# Patient Record
Sex: Female | Born: 2000 | Race: Black or African American | Hispanic: No | Marital: Single | Smoking: Never smoker
Health system: Southern US, Community
[De-identification: ages and names within clinical notes are randomized; demographics above are authoritative.]

## PROBLEM LIST (undated history)

## (undated) ENCOUNTER — Inpatient Hospital Stay (HOSPITAL_COMMUNITY): Payer: Self-pay

## (undated) DIAGNOSIS — E860 Dehydration: Secondary | ICD-10-CM

## (undated) DIAGNOSIS — F32A Depression, unspecified: Secondary | ICD-10-CM

## (undated) DIAGNOSIS — D649 Anemia, unspecified: Secondary | ICD-10-CM

## (undated) DIAGNOSIS — Z789 Other specified health status: Secondary | ICD-10-CM

## (undated) DIAGNOSIS — F329 Major depressive disorder, single episode, unspecified: Secondary | ICD-10-CM

## (undated) HISTORY — PX: NO PAST SURGERIES: SHX2092

## (undated) HISTORY — DX: Other specified health status: Z78.9

---
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
## 2018-01-09 ENCOUNTER — Other Ambulatory Visit: Payer: Medicaid Other

## 2018-01-09 ENCOUNTER — Ambulatory Visit (INDEPENDENT_AMBULATORY_CARE_PROVIDER_SITE_OTHER): Payer: Medicaid Other | Admitting: Women's Health

## 2018-01-09 ENCOUNTER — Encounter: Payer: Self-pay | Admitting: Women's Health

## 2018-01-09 ENCOUNTER — Other Ambulatory Visit: Payer: Self-pay

## 2018-01-09 VITALS — BP 121/65 | HR 90 | Wt 155.0 lb

## 2018-01-09 DIAGNOSIS — Z3402 Encounter for supervision of normal first pregnancy, second trimester: Secondary | ICD-10-CM

## 2018-01-09 DIAGNOSIS — Z1389 Encounter for screening for other disorder: Secondary | ICD-10-CM

## 2018-01-09 DIAGNOSIS — Z23 Encounter for immunization: Secondary | ICD-10-CM | POA: Diagnosis not present

## 2018-01-09 DIAGNOSIS — Z3A27 27 weeks gestation of pregnancy: Secondary | ICD-10-CM

## 2018-01-09 DIAGNOSIS — Z331 Pregnant state, incidental: Secondary | ICD-10-CM

## 2018-01-09 DIAGNOSIS — Z131 Encounter for screening for diabetes mellitus: Secondary | ICD-10-CM

## 2018-01-09 LAB — POCT URINALYSIS DIPSTICK OB
Glucose, UA: NEGATIVE
Ketones, UA: NEGATIVE
LEUKOCYTES UA: NEGATIVE
Nitrite, UA: NEGATIVE
POC,PROTEIN,UA: NEGATIVE
RBC UA: NEGATIVE

## 2018-01-09 NOTE — Patient Instructions (Signed)
Taylor Poole, I greatly value your feedback.  If you receive a survey following your visit with [place] today, we appreciate you taking the time to fill it out.  Thanks, Joellyn Haff, CNM, WHNP-BC   Call the office [phone]) or go to [facility] if:  You begin to have strong, frequent contractions  Your water breaks.  Sometimes it is a big gush of fluid, sometimes it is just a trickle that keeps getting your panties wet or running down your legs  You have vaginal bleeding.  It is normal to have a small amount of spotting if your cervix was checked.   You don't feel your baby moving like normal.  If you don't, get you something to eat and drink and lay down and focus on feeling your baby move.  You should feel at least 10 movements in 2 hours.  If you don't, you should call the office or go to [facility]    Tdap Vaccine  It is recommended that you get the Tdap vaccine during the third trimester of EACH pregnancy to help protect your baby from getting pertussis (whooping cough)  27-36 weeks is the BEST time to do this so that you can pass the protection on to your baby. During pregnancy is better than after pregnancy, but if you are unable to get it during pregnancy it will be offered at the hospital.   You can get this vaccine with [place], at the health department, your family doctor, or some local pharmacies  Everyone who will be around your baby should also be up-to-date on their vaccines before the baby comes. Adults (who are not pregnant) only need 1 dose of Tdap during adulthood.   Third Trimester of Pregnancy The third trimester is from week 29 through week 42, months 7 through 9. The third trimester is a time when the fetus is growing rapidly. At the end of the ninth month, the fetus is about 20 inches in length and weighs 6-10 pounds.  BODY CHANGES Your body goes through many changes during pregnancy. The changes vary from woman to woman.   Your weight will continue to  increase. You can expect to gain 25-35 pounds (11-16 kg) by the end of the pregnancy.  You may begin to get stretch marks on your hips, abdomen, and breasts.  You may urinate more often because the fetus is moving lower into your pelvis and pressing on your bladder.  You may develop or continue to have heartburn as a result of your pregnancy.  You may develop constipation because certain hormones are causing the muscles that push waste through your intestines to slow down.  You may develop hemorrhoids or swollen, bulging veins (varicose veins).  You may have pelvic pain because of the weight gain and pregnancy hormones relaxing your joints between the bones in your pelvis. Backaches may result from overexertion of the muscles supporting your posture.  You may have changes in your hair. These can include thickening of your hair, rapid growth, and changes in texture. Some women also have hair loss during or after pregnancy, or hair that feels dry or thin. Your hair will most likely return to normal after your baby is born.  Your breasts will continue to grow and be tender. A yellow discharge may leak from your breasts called colostrum.  Your belly button may stick out.  You may feel short of breath because of your expanding uterus.  You may notice the fetus "dropping," or moving lower in  your abdomen.  You may have a bloody mucus discharge. This usually occurs a few days to a week before labor begins.  Your cervix becomes thin and soft (effaced) near your due date. WHAT TO EXPECT AT YOUR PRENATAL EXAMS  You will have prenatal exams every 2 weeks until week 36. Then, you will have weekly prenatal exams. During a routine prenatal visit:  You will be weighed to make sure you and the fetus are growing normally.  Your blood pressure is taken.  Your abdomen will be measured to track your baby's growth.  The fetal heartbeat will be listened to.  Any test results from the previous visit  will be discussed.  You may have a cervical check near your due date to see if you have effaced. At around 36 weeks, your caregiver will check your cervix. At the same time, your caregiver will also perform a test on the secretions of the vaginal tissue. This test is to determine if a type of bacteria, Group B streptococcus, is present. Your caregiver will explain this further. Your caregiver may ask you:  What your birth plan is.  How you are feeling.  If you are feeling the baby move.  If you have had any abnormal symptoms, such as leaking fluid, bleeding, severe headaches, or abdominal cramping.  If you have any questions. Other tests or screenings that may be performed during your third trimester include:  Blood tests that check for low iron levels (anemia).  Fetal testing to check the health, activity level, and growth of the fetus. Testing is done if you have certain medical conditions or if there are problems during the pregnancy. FALSE LABOR You may feel small, irregular contractions that eventually go away. These are called Braxton Hicks contractions, or false labor. Contractions may last for hours, days, or even weeks before true labor sets in. If contractions come at regular intervals, intensify, or become painful, it is best to be seen by your caregiver.  SIGNS OF LABOR   Menstrual-like cramps.  Contractions that are 5 minutes apart or less.  Contractions that start on the top of the uterus and spread down to the lower abdomen and back.  A sense of increased pelvic pressure or back pain.  A watery or bloody mucus discharge that comes from the vagina. If you have any of these signs before the 37th week of pregnancy, call your caregiver right away. You need to go to the hospital to get checked immediately. HOME CARE INSTRUCTIONS   Avoid all smoking, herbs, alcohol, and unprescribed drugs. These chemicals affect the formation and growth of the baby.  Follow your  caregiver's instructions regarding medicine use. There are medicines that are either safe or unsafe to take during pregnancy.  Exercise only as directed by your caregiver. Experiencing uterine cramps is a good sign to stop exercising.  Continue to eat regular, healthy meals.  Wear a good support bra for breast tenderness.  Do not use hot tubs, steam rooms, or saunas.  Wear your seat belt at all times when driving.  Avoid raw meat, uncooked cheese, cat litter boxes, and soil used by cats. These carry germs that can cause birth defects in the baby.  Take your prenatal vitamins.  Try taking a stool softener (if your caregiver approves) if you develop constipation. Eat more high-fiber foods, such as fresh vegetables or fruit and whole grains. Drink plenty of fluids to keep your urine clear or pale yellow.  Take warm sitz baths to  soothe any pain or discomfort caused by hemorrhoids. Use hemorrhoid cream if your caregiver approves.  If you develop varicose veins, wear support hose. Elevate your feet for 15 minutes, 3-4 times a day. Limit salt in your diet.  Avoid heavy lifting, wear low heal shoes, and practice good posture.  Rest a lot with your legs elevated if you have leg cramps or low back pain.  Visit your dentist if you have not gone during your pregnancy. Use a soft toothbrush to brush your teeth and be gentle when you floss.  A sexual relationship may be continued unless your caregiver directs you otherwise.  Do not travel far distances unless it is absolutely necessary and only with the approval of your caregiver.  Take prenatal classes to understand, practice, and ask questions about the labor and delivery.  Make a trial run to the hospital.  Pack your hospital bag.  Prepare the baby's nursery.  Continue to go to all your prenatal visits as directed by your caregiver. SEEK MEDICAL CARE IF:  You are unsure if you are in labor or if your water has broken.  You have  dizziness.  You have mild pelvic cramps, pelvic pressure, or nagging pain in your abdominal area.  You have persistent nausea, vomiting, or diarrhea.  You have a bad smelling vaginal discharge.  You have pain with urination. SEEK IMMEDIATE MEDICAL CARE IF:   You have a fever.  You are leaking fluid from your vagina.  You have spotting or bleeding from your vagina.  You have severe abdominal cramping or pain.  You have rapid weight loss or gain.  You have shortness of breath with chest pain.  You notice sudden or extreme swelling of your face, hands, ankles, feet, or legs.  You have not felt your baby move in over an hour.  You have severe headaches that do not go away with medicine.  You have vision changes. Document Released: 02/19/2001 Document Revised: 03/02/2013 Document Reviewed: 04/28/2012 [facility] Patient Information 2015 [place], [place]. This information is not intended to replace advice given to you by your health care provider. Make sure you discuss any questions you have with your health care provider.

## 2018-01-09 NOTE — Progress Notes (Signed)
   LOW-RISK PREGNANCY VISIT Patient name: Taylor Poole MRN [id number]  Date of birth: Jul 01, 2000 Chief Complaint:   Routine Prenatal Visit (Pn2 today)  History of Present Illness:   Taylor Poole is a 17 y.o. G1P0 female at [redacted]w[redacted]d with an Estimated Date of Delivery: 04/07/18 being seen today for ongoing management of a low-risk pregnancy.  Today she reports no complaints. Contractions: Not present. Vag. Bleeding: None.  Movement: Present. denies leaking of fluid. Review of Systems:   Pertinent items are noted in HPI Denies abnormal vaginal discharge w/ itching/odor/irritation, headaches, visual changes, shortness of breath, chest pain, abdominal pain, severe nausea/vomiting, or problems with urination or bowel movements unless otherwise stated above. Pertinent History Reviewed:  Reviewed past medical,surgical, social, obstetrical and family history.  Reviewed problem list, medications and allergies. Physical Assessment:   Vitals:   01/09/18 1015  BP: 121/65  Pulse: 90  Weight: 155 lb (70.3 kg)  Body mass index is 28.12 kg/m.        Physical Examination:   General appearance: Well appearing, and in no distress  Mental status: Alert, oriented to person, place, and time  Skin: Warm & dry  Cardiovascular: Normal heart rate noted  Respiratory: Normal respiratory effort, no distress  Abdomen: Soft, gravid, nontender  Pelvic: Cervical exam deferred         Extremities: Edema: None  Fetal Status: Fetal Heart Rate (bpm): 148 Fundal Height: 26 cm Movement: Present    Results for orders placed or performed in visit on 01/09/18 (from the past 24 hour(s))  POC Urinalysis Dipstick OB   Collection Time: 01/09/18 10:16 AM  Result Value Ref Range   Color, UA     Clarity, UA     Glucose, UA Negative Negative   Bilirubin, UA     Ketones, UA neg    Spec Grav, UA     Blood, UA neg    pH, UA     POC Protein UA Negative Negative, Trace   Urobilinogen, UA     Nitrite, UA neg    Leukocytes,  UA Negative Negative   Appearance     Odor      Assessment & Plan:  1) Low-risk pregnancy G1P0 at [redacted]w[redacted]d with an Estimated Date of Delivery: 04/07/18    Meds: No orders of the defined types were placed in this encounter.  Labs/procedures today: pn2, flu & tdap  Plan:  Continue routine obstetrical care   Reviewed: Preterm labor symptoms and general obstetric precautions including but not limited to vaginal bleeding, contractions, leaking of fluid and fetal movement were reviewed in detail with the patient.  All questions were answered  Follow-up: Return in about 4 weeks (around 02/06/2018) for LROB.  Orders Placed This Encounter  Procedures  . Tdap vaccine greater than or equal to 19yo IM  . POC Urinalysis Dipstick OB   Cheral Marker CNM, [facility] 01/09/2018 10:27 AM

## 2018-01-10 LAB — CBC
HEMOGLOBIN: 11.4 g/dL (ref 11.1–15.9)
Hematocrit: 33.5 % — ABNORMAL LOW (ref 34.0–46.6)
MCH: 30 pg (ref 26.6–33.0)
MCHC: 34 g/dL (ref 31.5–35.7)
MCV: 88 fL (ref 79–97)
Platelets: 130 10*3/uL — ABNORMAL LOW (ref 150–450)
RBC: 3.8 x10E6/uL (ref 3.77–5.28)
RDW: 13.5 % (ref 12.3–15.4)
WBC: 11.2 10*3/uL — ABNORMAL HIGH (ref 3.4–10.8)

## 2018-01-10 LAB — ANTIBODY SCREEN: ANTIBODY SCREEN: NEGATIVE

## 2018-01-10 LAB — GLUCOSE TOLERANCE, 2 HOURS W/ 1HR
GLUCOSE, 2 HOUR: 97 mg/dL (ref 65–152)
GLUCOSE, FASTING: 77 mg/dL (ref 65–91)
Glucose, 1 hour: 92 mg/dL (ref 65–179)

## 2018-01-10 LAB — RPR: RPR Ser Ql: NONREACTIVE

## 2018-01-10 LAB — HIV ANTIBODY (ROUTINE TESTING W REFLEX): HIV SCREEN 4TH GENERATION: NONREACTIVE

## 2018-01-26 ENCOUNTER — Emergency Department (HOSPITAL_COMMUNITY)
Admission: EM | Admit: 2018-01-26 | Discharge: 2018-01-27 | Disposition: A | Discharge status: Home / Self Care | Payer: Medicaid Other | Attending: Emergency Medicine | Admitting: Emergency Medicine

## 2018-01-26 ENCOUNTER — Encounter (HOSPITAL_COMMUNITY): Payer: Self-pay | Admitting: Emergency Medicine

## 2018-01-26 ENCOUNTER — Other Ambulatory Visit: Payer: Self-pay

## 2018-01-26 DIAGNOSIS — B3731 Acute candidiasis of vulva and vagina: Secondary | ICD-10-CM

## 2018-01-26 DIAGNOSIS — B373 Candidiasis of vulva and vagina: Secondary | ICD-10-CM | POA: Diagnosis not present

## 2018-01-26 DIAGNOSIS — R300 Dysuria: Secondary | ICD-10-CM | POA: Insufficient documentation

## 2018-01-26 DIAGNOSIS — Z3A28 28 weeks gestation of pregnancy: Secondary | ICD-10-CM | POA: Diagnosis not present

## 2018-01-26 DIAGNOSIS — O2343 Unspecified infection of urinary tract in pregnancy, third trimester: Secondary | ICD-10-CM | POA: Diagnosis not present

## 2018-01-26 DIAGNOSIS — Z79899 Other long term (current) drug therapy: Secondary | ICD-10-CM | POA: Insufficient documentation

## 2018-01-26 DIAGNOSIS — O9989 Other specified diseases and conditions complicating pregnancy, childbirth and the puerperium: Secondary | ICD-10-CM | POA: Diagnosis present

## 2018-01-26 LAB — WET PREP, GENITAL
CLUE CELLS WET PREP: NONE SEEN
SPERM: NONE SEEN
Trich, Wet Prep: NONE SEEN

## 2018-01-26 LAB — URINALYSIS, ROUTINE W REFLEX MICROSCOPIC
Bilirubin Urine: NEGATIVE
Glucose, UA: 50 mg/dL — AB
Hgb urine dipstick: NEGATIVE
Ketones, ur: NEGATIVE mg/dL
Nitrite: NEGATIVE
PH: 6 (ref 5.0–8.0)
PROTEIN: NEGATIVE mg/dL
Specific Gravity, Urine: 1.029 (ref 1.005–1.030)

## 2018-01-26 NOTE — ED Triage Notes (Signed)
Pt states she had sex with her baby father last night and she noticed some burning with urination and vaginal pain today. Pt and pts father have recently been treated for STD. Pt denies fever.

## 2018-01-27 MED ORDER — CEPHALEXIN 500 MG PO CAPS: 500.0000 mg | ORAL_CAPSULE | Freq: Four times a day (QID) | ORAL | 0 refills | Status: DC

## 2018-01-27 MED ORDER — CLOTRIMAZOLE 1 % VA CREA: 1.0000 | TOPICAL_CREAM | Freq: Every day | VAGINAL | 0 refills | Status: DC

## 2018-01-27 NOTE — ED Provider Notes (Signed)
[facility] EMERGENCY DEPARTMENT Provider Note   CSN: [id number] Arrival date & time: 01/26/18  2143     History   Chief Complaint Chief Complaint  Patient presents with  . Dysuria    HPI Taylor Poole is a 17 y.o. female.  HPI   Taylor Poole is a 17 y.o. female [redacted] weeks pregnant, G1P0Ab0 who presents to the Emergency Department complaining of vaginal irritation and burning with urination upon waking.  She states that she had unprotected intercourse on the evening prior to onset of her symptoms and she is concerned that she may have a urinary tract infection or an STD.  She denies fever, chills, abdominal pain, vaginal bleeding or excessive vaginal discharge.  She receives routine prenatal care and denies any complications with pregnancy and endorses frequent fetal movement.  She also states that she and her partner were treated for chlamydia in August of this year.  Past Medical History:  Diagnosis Date  . Dehydration 10/09/2016  . Medical history non-contributory     Patient Active Problem List   Diagnosis Date Noted  . 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 GBS bacteriuria 09/26/2017  . Supervision of normal first pregnancy 09/24/2017  . Intractable vomiting with nausea 07/03/2017  . Cannabinoid hyperemesis syndrome (HCC) 07/03/2017  . Hypokalemia 07/03/2017  . Gastroenteritis, acute 10/05/2016    Past Surgical History:  Procedure Laterality Date  . NO PAST SURGERIES       OB History    Gravida  1   Para      Term      Preterm      AB      Living        SAB      TAB      Ectopic      Multiple      Live Births               Home Medications    Prior to Admission medications   Medication Sig Start Date End Date Taking? Authorizing Provider  cephALEXin (KEFLEX) 500 MG capsule Take 1 capsule (500 mg total) by mouth 4 (four) times daily. 01/27/18   Wannetta Langland, PA-C  clotrimazole (GYNE-LOTRIMIN) 1 % vaginal cream Place 1 Applicatorful  vaginally at bedtime. For 7 days 01/27/18   Lucile Hillmann, PA-C  ondansetron (ZOFRAN ODT) 4 MG disintegrating tablet Take 1 tablet (4 mg total) by mouth every 6 (six) hours as needed for nausea. Patient not taking: Reported on 10/30/2017 09/24/17   Jacklyn Shell, CNM  Prenatal Vit-Fe Fumarate-FA (PRENATAL COMPLETE) 14-0.4 MG TABS Take 1 tablet by mouth daily. 07/28/17   Rancour, Jeannett Senior, MD  promethazine (PHENERGAN) 25 MG tablet Take 1 tablet (25 mg total) by mouth every 6 (six) hours as needed. Patient not taking: Reported on 09/24/2017 07/04/17   Donnetta Hutching, MD  terconazole (TERAZOL 7) 0.4 % vaginal cream Place 1 applicator vaginally at bedtime. Patient not taking: Reported on 01/09/2018 12/23/17   Tilda Burrow, MD    Family History No family history on file.  Social History Social History   Tobacco Use  . Smoking status: Never Smoker  . Smokeless tobacco: Never Used  Substance Use Topics  . Alcohol use: No  . Drug use: Not Currently    Types: Marijuana     Allergies   Patient has no known allergies.   Review of Systems Review of Systems  Constitutional: Negative for activity change, appetite change, chills and fever.  Respiratory: Negative for chest  tightness and shortness of breath.   Gastrointestinal: Negative for abdominal pain, diarrhea, nausea and vomiting.  Genitourinary: Positive for dysuria and vaginal pain (Vaginal irritation). Negative for decreased urine volume, difficulty urinating, flank pain, frequency, hematuria, pelvic pain, urgency, vaginal bleeding and vaginal discharge.  Musculoskeletal: Negative for back pain.  Skin: Negative for rash.  Neurological: Negative for dizziness, weakness and numbness.  Hematological: Negative for adenopathy.  Psychiatric/Behavioral: Negative for confusion.     Physical Exam Updated Vital Signs BP (!) 103/53 (BP Location: Right Arm)   Pulse 76   Temp 98.1 F (36.7 C) (Oral)   Resp 17   LMP 07/01/2017  (Exact Date)   SpO2 100%   Physical Exam  Constitutional: She appears well-developed and well-nourished. No distress.  HENT:  Head: Normocephalic and atraumatic.  Cardiovascular: Normal rate, regular rhythm and intact distal pulses.  No murmur heard. Pulmonary/Chest: Effort normal and breath sounds normal. No respiratory distress. She has no wheezes. She has no rales.  Abdominal: Normal appearance. There is no hepatosplenomegaly. There is no tenderness. There is no rigidity, no guarding, no CVA tenderness and no tenderness at McBurney's point.  Abdomen is soft, gravid.  No CVA tenderness  Genitourinary:  Genitourinary Comments: Pelvic exam assisted by nursing staff.  Cervical loss is closed.  Small amount of milky white vaginal discharge present.  No vaginal bleeding.  No cervical motion tenderness.  No adnexal masses or tenderness palpated  Musculoskeletal: Normal range of motion. She exhibits no edema.  Neurological: She is alert. No sensory deficit.  Skin: Skin is warm and dry. No rash noted.  Psychiatric: She has a normal mood and affect.  Nursing note and vitals reviewed.    ED Treatments / Results  Labs (all labs ordered are listed, but only abnormal results are displayed) Labs Reviewed  WET PREP, GENITAL - Abnormal; Notable for the following components:      Result Value   Yeast Wet Prep HPF POC PRESENT (*)    WBC, Wet Prep HPF POC MODERATE (*)    All other components within normal limits  URINALYSIS, ROUTINE W REFLEX MICROSCOPIC - Abnormal; Notable for the following components:   APPearance HAZY (*)    Glucose, UA 50 (*)    Leukocytes, UA MODERATE (*)    Bacteria, UA RARE (*)    All other components within normal limits  GC/CHLAMYDIA PROBE AMP ([place]) NOT AT [facility]    EKG None  Radiology No results found.  Procedures Procedures (including critical care time)  Medications Ordered in ED Medications - No data to display   Initial Impression / Assessment  and Plan / ED Course  I have reviewed the triage vital signs and the nursing notes.  Pertinent labs & imaging results that were available during my care of the patient were reviewed by me and considered in my medical decision making (see chart for details).     FHT's 160  Patient well-appearing with dysuria during third trimester of first pregnancy.  No abdominal tenderness or vaginal bleeding.  She has upcoming appointment with her OB.  Wet prep shows presence of Candida and likely UTI.  Patient agrees to treatment plan with clotrimazole and Keflex.  She is appropriate for discharge home and agrees to close OB follow-up.  Return precautions discussed.  Final Clinical Impressions(s) / ED Diagnoses   Final diagnoses:  UTI (urinary tract infection) in pregnancy in third trimester  Vulvovaginal candidiasis    ED Discharge Orders  Ordered    clotrimazole (GYNE-LOTRIMIN) 1 % vaginal cream  Daily at bedtime     01/27/18 0024    cephALEXin (KEFLEX) 500 MG capsule  4 times daily     01/27/18 0024           Pauline Ausriplett, Quentez Lober, PA-C 01/28/18 1443    Long, Arlyss RepressJoshua G, MD 01/28/18 1524

## 2018-01-27 NOTE — Discharge Instructions (Addendum)
Drink plenty of water.  Take the medication as directed until its finished.  Follow-up next week with your OB provider.

## 2018-01-28 ENCOUNTER — Encounter: Payer: Self-pay | Admitting: *Deleted

## 2018-01-28 LAB — GC/CHLAMYDIA PROBE AMP (~~LOC~~) NOT AT ARMC
CHLAMYDIA, DNA PROBE: NEGATIVE
NEISSERIA GONORRHEA: NEGATIVE

## 2018-02-02 ENCOUNTER — Encounter (HOSPITAL_COMMUNITY): Payer: Self-pay | Admitting: Emergency Medicine

## 2018-02-02 ENCOUNTER — Other Ambulatory Visit: Payer: Self-pay | Admitting: Obstetrics and Gynecology

## 2018-02-02 ENCOUNTER — Other Ambulatory Visit: Payer: Self-pay

## 2018-02-02 ENCOUNTER — Inpatient Hospital Stay (HOSPITAL_COMMUNITY)
Admission: EM | Admit: 2018-02-02 | Discharge: 2018-02-03 | DRG: 833 | Disposition: A | Discharge status: Home / Self Care | Payer: Medicaid Other | Attending: Obstetrics and Gynecology | Admitting: Obstetrics and Gynecology

## 2018-02-02 DIAGNOSIS — Z3A30 30 weeks gestation of pregnancy: Secondary | ICD-10-CM

## 2018-02-02 DIAGNOSIS — S3991XA Unspecified injury of abdomen, initial encounter: Secondary | ICD-10-CM | POA: Diagnosis present

## 2018-02-02 DIAGNOSIS — R8271 Bacteriuria: Secondary | ICD-10-CM

## 2018-02-02 DIAGNOSIS — R109 Unspecified abdominal pain: Secondary | ICD-10-CM | POA: Diagnosis present

## 2018-02-02 DIAGNOSIS — O26893 Other specified pregnancy related conditions, third trimester: Secondary | ICD-10-CM | POA: Diagnosis present

## 2018-02-02 DIAGNOSIS — Z3400 Encounter for supervision of normal first pregnancy, unspecified trimester: Secondary | ICD-10-CM

## 2018-02-02 DIAGNOSIS — O99119 Other diseases of the blood and blood-forming organs and certain disorders involving the immune mechanism complicating pregnancy, unspecified trimester: Secondary | ICD-10-CM

## 2018-02-02 DIAGNOSIS — O368130 Decreased fetal movements, third trimester, not applicable or unspecified: Secondary | ICD-10-CM | POA: Diagnosis present

## 2018-02-02 DIAGNOSIS — D696 Thrombocytopenia, unspecified: Secondary | ICD-10-CM | POA: Diagnosis present

## 2018-02-02 DIAGNOSIS — S3991XS Unspecified injury of abdomen, sequela: Secondary | ICD-10-CM | POA: Diagnosis not present

## 2018-02-02 DIAGNOSIS — Z9189 Other specified personal risk factors, not elsewhere classified: Secondary | ICD-10-CM

## 2018-02-02 LAB — CBC
HCT: 34.6 % — ABNORMAL LOW (ref 36.0–46.0)
Hemoglobin: 11.5 g/dL — ABNORMAL LOW (ref 12.0–15.0)
MCH: 29.7 pg (ref 26.0–34.0)
MCHC: 33.2 g/dL (ref 30.0–36.0)
MCV: 89.4 fL (ref 80.0–100.0)
NRBC: 0 % (ref 0.0–0.2)
PLATELETS: 140 10*3/uL — AB (ref 150–400)
RBC: 3.87 MIL/uL (ref 3.87–5.11)
RDW: 13.8 % (ref 11.5–15.5)
WBC: 9.4 10*3/uL (ref 4.0–10.5)

## 2018-02-02 LAB — ABO/RH: ABO/RH(D): A POS

## 2018-02-02 LAB — TYPE AND SCREEN
ABO/RH(D): A POS
ANTIBODY SCREEN: NEGATIVE

## 2018-02-02 LAB — KLEIHAUER-BETKE STAIN
# Vials RhIg: 1
Fetal Cells %: 0 %
Quantitation Fetal Hemoglobin: 0 mL

## 2018-02-02 MED ORDER — LACTATED RINGERS IV SOLN
INTRAVENOUS | Status: DC
  Administered 2018-02-02: 19:00:00 via INTRAVENOUS

## 2018-02-02 MED ORDER — PRENATAL MULTIVITAMIN CH: 1.0000 | ORAL_TABLET | Freq: Every day | ORAL | Status: DC

## 2018-02-02 MED ORDER — ACETAMINOPHEN 325 MG PO TABS: 650.0000 mg | ORAL_TABLET | ORAL | Status: DC | PRN

## 2018-02-02 MED ORDER — ZOLPIDEM TARTRATE 5 MG PO TABS: 5.0000 mg | ORAL_TABLET | Freq: Every evening | ORAL | Status: DC | PRN
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
## 2018-02-02 NOTE — ED Notes (Signed)
Pt offered to use bathroom, states she does not have to at this time.
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
## 2018-02-03 DIAGNOSIS — O26893 Other specified pregnancy related conditions, third trimester: Secondary | ICD-10-CM

## 2018-02-03 DIAGNOSIS — O99119 Other diseases of the blood and blood-forming organs and certain disorders involving the immune mechanism complicating pregnancy, unspecified trimester: Secondary | ICD-10-CM

## 2018-02-03 DIAGNOSIS — Z9189 Other specified personal risk factors, not elsewhere classified: Secondary | ICD-10-CM

## 2018-02-03 DIAGNOSIS — S3991XS Unspecified injury of abdomen, sequela: Secondary | ICD-10-CM

## 2018-02-03 DIAGNOSIS — D696 Thrombocytopenia, unspecified: Secondary | ICD-10-CM | POA: Diagnosis present

## 2018-02-03 DIAGNOSIS — R109 Unspecified abdominal pain: Secondary | ICD-10-CM

## 2018-02-03 NOTE — Discharge Instructions (Signed)
° °  Follow these instructions at home:  Take over-the-counter and prescription medicines only as told by your health care provider. Do not take any medicines that your health care provider has not approved.  Arrange for help at home before and after you deliver your baby, especially if you had a cesarean delivery or if you lost a lot of blood.  Get plenty of rest and sleep.  Do not use illegal drugs.  Do not drink alcohol.  Do not have sexual intercourse until your health care provider says it is okay.  Do not use tampons or douche unless your health care provider says it is okay.  Do not use any products that contain nicotine or tobacco, such as cigarettes and e-cigarettes. If you need help quitting, ask your health care provider. Get help right away if:  You have vaginal bleeding or spotting.  You have any type of trauma, such as a fall, abdominal trauma, or a car accident.  You have abdominal pain.  You have continuous uterine contractions.  You have a hard, tender uterus.  You do not feel the baby move, or the baby moves very little. This information is not intended to replace advice given to you by your health care provider. Make sure you discuss any questions you have with your health care provider. Document Released: 02/25/2005 Document Revised: 10/26/2015 Document Reviewed: 09/17/2015 Elsevier Interactive Patient Education  [employer]2018 Elsevier Inc.

## 2018-02-03 NOTE — Progress Notes (Signed)
Pt discharged with printed instructions. Pt verbalized an understanding. No concerns noted. Somaya Grassi L Cynithia Hakimi, RN 
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
## 2018-02-04 ENCOUNTER — Encounter: Payer: Medicaid Other | Admitting: Advanced Practice Midwife

## 2018-02-12 ENCOUNTER — Ambulatory Visit (INDEPENDENT_AMBULATORY_CARE_PROVIDER_SITE_OTHER): Payer: Medicaid Other | Admitting: Obstetrics and Gynecology

## 2018-02-12 ENCOUNTER — Encounter: Payer: Self-pay | Admitting: Obstetrics and Gynecology

## 2018-02-12 VITALS — BP 112/70 | HR 78 | Wt 160.0 lb

## 2018-02-12 DIAGNOSIS — O99113 Other diseases of the blood and blood-forming organs and certain disorders involving the immune mechanism complicating pregnancy, third trimester: Secondary | ICD-10-CM | POA: Diagnosis not present

## 2018-02-12 DIAGNOSIS — R300 Dysuria: Secondary | ICD-10-CM

## 2018-02-12 DIAGNOSIS — Z658 Other specified problems related to psychosocial circumstances: Secondary | ICD-10-CM | POA: Insufficient documentation

## 2018-02-12 DIAGNOSIS — Z331 Pregnant state, incidental: Secondary | ICD-10-CM

## 2018-02-12 DIAGNOSIS — D696 Thrombocytopenia, unspecified: Secondary | ICD-10-CM | POA: Insufficient documentation

## 2018-02-12 DIAGNOSIS — O26893 Other specified pregnancy related conditions, third trimester: Secondary | ICD-10-CM

## 2018-02-12 DIAGNOSIS — Z1389 Encounter for screening for other disorder: Secondary | ICD-10-CM

## 2018-02-12 DIAGNOSIS — Z3403 Encounter for supervision of normal first pregnancy, third trimester: Secondary | ICD-10-CM

## 2018-02-12 DIAGNOSIS — Z3A32 32 weeks gestation of pregnancy: Secondary | ICD-10-CM

## 2018-02-12 LAB — POCT URINALYSIS DIPSTICK OB
Glucose, UA: NEGATIVE
Ketones, UA: NEGATIVE
NITRITE UA: NEGATIVE
POC,PROTEIN,UA: NEGATIVE
RBC UA: NEGATIVE

## 2018-02-12 NOTE — Patient Instructions (Signed)
Childbirth Classes  [facility]  Call to Register: [phone] or [phone]    or    Register Online: [email or web address]  THESE CLASSES FILL UP VERY QUICKLY, SO SIGN UP AS SOON AS YOU CAN!!! Please visit [facility] pregnancy website at [email or web address]   Option 1: Birth & Baby Series ? This series of 3 weekly classes helps you and your labor partner prepare [facility] at [facility]. ? Reviews newborn care, labor & birth, cesarean birth, pain management, and comfort techniques ? Maternity Care Center Tour of [facility] is included.  ? Cost: $60 per couple for insured or self-pay, $30 per couple for Medicaid  Option 2: Weekend Birth & Baby ? This class is a weekend version of our Birth & Baby series.  It is designed for parents who have a difficult time fitting several weeks of classes into their schedule.   ? Maternity Care Center Tour of [facility] is included ? Friday 6:30pm-8:30pm  Saturday 9am-4pm  ? Cost: $75 per couple for insured or self-pay, $30 per couple for Medicaid  Option 3: Natural Childbirth ? This series of 5 weekly classes is for expectant parents who want to learn and practice natural methods of coping with the process of labor and childbirth.     ? Maternity Care Center Tour of [facility] is included ? Cost: $75 per couple for insured or self-pay, $30 per couple for Medicaid  Option 4: Online Birth & Baby ? This online class offers you the freedom to complete a Birth & Baby series in the comfort of your own home.  The flexibility of this option allows you to review sections at your own pace, at times convenient to you and your support people.    ? Cost: $60 for 60 days of online access       Other Available Classes  Baby & Me Enjoy this time to discuss newborn & infant parenting topics and family adjustment issues with other new mothers in a relaxed environment. Each week brings a new speaker or  baby-centered activity. We encourage mothers and their babies (birth to crawling) to join [place] every Thursday in the [facility] at 11:00am. You are welcome to visit this group even if you haven't delivered yet! It's wonderful to make new friends early and watch other moms interact with their babies. No registration or fee.  Big Brother/Big Sister Let your children share in the joy of a new brother or sister in this special class designed just for them. This class is designed for children ages 2 to 256, but any age is welcome. Please register each child individually.  Breastfeeding Support Group This group is a mother-to-mother support circle where moms have the opportunity to share their breastfeeding experiences. A Breastfeeding Support nurse is present for questions and concerns. Meets each Tuesday at 11:00am. No fee or registration.  Breastfeeding Your Baby Breastfeeding is a special time for mother and child. This class will help you feel ready to begin this important relationship. Your partner is encouraged to attend with you.  Caring For Baby This class is for expectant and adoptive parents who want to learn and practice the most up-to-date newborn care for their babies. Register only the mom-to-be and your partner can plan to come with you. (*Note: This class is included in the Birth & Baby series and the Weekend Birth & Baby classes.)  Comfort Techniques & Tour This 2-hour interactive class will provide you the opportunity to learn &  practice hands-on techniques with your partner that can help relieve some of the discomfort of labor and encourage your baby to rotate toward the best position for birth. A tour of the [facility]aternity Care Center is included.  °Daddy Boot Camp °This course offers Dads-to-be the tools and knowledge needed to feel confident on their journey to becoming new fathers.  °Grandparent Love °Expecting a grandbaby? Learn about the latest infant care  and safety recommendations and ways to support your own child as he or she transitions into the parenting role.  °Infant and Child CPR °Parents, grandparents, babysitters, and friends learn Cardio-Pulmonary Resuscitation skills for infants and children. Register each participant individually. (Note: This Family & Friends program does not offer certification.)  °Marvelous Multiples °Expecting twins, triplets, or more? This class covers the differences in labor, birth, parenting, and breastfeeding issues that face multiples’ parents. NICU tour is included.  °Mom Talk °This mom-led group offers support and connection to mothers as they journey through the adjustments and struggles of that sometimes overwhelming first year after the birth of a child. A member of our [facility] staff will be present to share resources and additional support if needed, as you care for yourself and baby. You are welcome to visit this group before you deliver! It's wonderful to meet new friends early and watch other moms interact with their babies. It’s held at [facility] at 10:00am each Tuesday morning and 6:00pm each Thursday evening. Babies (birth to crawling) welcome. No registration or fee.  °Waterbirth Class °Interested in a waterbirth? This informational class will help you discover whether waterbirth is the right fit for you.  °[facility] Virtual Maternity Tour °View a virtual tour of [facility]. In-person tours are available for participants of childbirth education classes.  °
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
## 2018-02-13 ENCOUNTER — Encounter: Payer: Medicaid Other | Admitting: Women's Health

## 2018-02-14 LAB — URINE CULTURE: ORGANISM ID, BACTERIA: NO GROWTH

## 2018-02-26 ENCOUNTER — Encounter: Payer: Medicaid Other | Admitting: Obstetrics and Gynecology

## 2018-03-11 NOTE — L&D Delivery Note (Addendum)
Delivery Note At 2:58 PM a viable female was delivered via Vaginal, Spontaneous (Presentation: vertex; OA).  APGAR: 8, 9; weight: pending. Placenta status: delivered spontaneously intact.  Cord: 3-vessel cord with the following complications: none.  Cord pH: N/A  Anesthesia: Epidural Episiotomy: None Lacerations: None Suture Repair: None Est. Blood Loss (mL): 20  The patient was noted to be complete and pushing. Patient noted to have epidural anesthesia.  The patient was asked to push and the head delivered spontaneously in the oa position, over an intact perineum. A nuchal cord was checked and none was noted. Compound left hand was present.    The anterior shoulder delivered easily and the posterior shoulder followed. The remainder of the infant was easily delivered and placed on mom's chest skin-to-skin where nursing personnel were in attendance. The infant was noted to have spontaneous cry and movement of all 4 extremities. The oropharynx and nasopharynx were bulb suctioned. After a 1-minute delay he cord was clamped x 2 and cut by the father of the baby.   The placenta delivered intact spontaneously. Placenta sent to pathology due to presumed Triple I with maternal fevers. Pitocin was started IV to firm the uterus.   Examination of the cervix and vaginal vault did not reveal any lacerations. A vaginal pack was then placed. Examination of the perineum showed no lacerations.   All sponge and needle counts were correct. Dr. Elita Quick was present for the entire procedure.     Mom to postpartum.  Baby to Couplet care / Skin to Skin.  Dollene Cleveland 04/03/2018, 3:22 PM  OB FELLOW DELIVERY ATTESTATION  I was gloved and present for the delivery in its entirety, and I agree with the above resident's note.    Marcy Siren, D.O. OB Fellow  04/03/2018, 5:24 PM

## 2018-03-16 ENCOUNTER — Encounter: Payer: Medicaid Other | Admitting: Obstetrics and Gynecology

## 2018-03-17 ENCOUNTER — Encounter: Payer: Medicaid Other | Admitting: Obstetrics and Gynecology

## 2018-03-26 ENCOUNTER — Inpatient Hospital Stay (HOSPITAL_COMMUNITY)
Admission: AD | Admit: 2018-03-26 | Discharge: 2018-03-26 | Disposition: A | Discharge status: Home / Self Care | Payer: Medicaid Other | Attending: Obstetrics and Gynecology | Admitting: Obstetrics and Gynecology

## 2018-03-26 ENCOUNTER — Encounter (HOSPITAL_COMMUNITY): Payer: Self-pay

## 2018-03-26 DIAGNOSIS — O163 Unspecified maternal hypertension, third trimester: Secondary | ICD-10-CM | POA: Insufficient documentation

## 2018-03-26 DIAGNOSIS — N949 Unspecified condition associated with female genital organs and menstrual cycle: Secondary | ICD-10-CM

## 2018-03-26 DIAGNOSIS — O471 False labor at or after 37 completed weeks of gestation: Secondary | ICD-10-CM | POA: Diagnosis not present

## 2018-03-26 DIAGNOSIS — Z3A38 38 weeks gestation of pregnancy: Secondary | ICD-10-CM | POA: Insufficient documentation

## 2018-03-26 DIAGNOSIS — R102 Pelvic and perineal pain: Secondary | ICD-10-CM | POA: Insufficient documentation

## 2018-03-26 DIAGNOSIS — R0989 Other specified symptoms and signs involving the circulatory and respiratory systems: Secondary | ICD-10-CM

## 2018-03-26 DIAGNOSIS — R1031 Right lower quadrant pain: Secondary | ICD-10-CM | POA: Diagnosis present

## 2018-03-26 DIAGNOSIS — O26893 Other specified pregnancy related conditions, third trimester: Secondary | ICD-10-CM | POA: Diagnosis not present

## 2018-03-26 DIAGNOSIS — R8271 Bacteriuria: Secondary | ICD-10-CM

## 2018-03-26 LAB — URINALYSIS, ROUTINE W REFLEX MICROSCOPIC
BACTERIA UA: NONE SEEN
BILIRUBIN URINE: NEGATIVE
GLUCOSE, UA: NEGATIVE mg/dL
HGB URINE DIPSTICK: NEGATIVE
Ketones, ur: NEGATIVE mg/dL
NITRITE: NEGATIVE
Protein, ur: NEGATIVE mg/dL
SPECIFIC GRAVITY, URINE: 1.012 (ref 1.005–1.030)
pH: 6 (ref 5.0–8.0)

## 2018-03-26 LAB — COMPREHENSIVE METABOLIC PANEL
ALBUMIN: 3.1 g/dL — AB (ref 3.5–5.0)
ALK PHOS: 97 U/L (ref 38–126)
ALT: 13 U/L (ref 0–44)
ANION GAP: 7 (ref 5–15)
AST: 16 U/L (ref 15–41)
BILIRUBIN TOTAL: 0.5 mg/dL (ref 0.3–1.2)
BUN: 8 mg/dL (ref 6–20)
CALCIUM: 8.6 mg/dL — AB (ref 8.9–10.3)
CO2: 20 mmol/L — ABNORMAL LOW (ref 22–32)
Chloride: 105 mmol/L (ref 98–111)
Creatinine, Ser: 0.7 mg/dL (ref 0.44–1.00)
Glucose, Bld: 85 mg/dL (ref 70–99)
POTASSIUM: 3.9 mmol/L (ref 3.5–5.1)
Sodium: 132 mmol/L — ABNORMAL LOW (ref 135–145)
TOTAL PROTEIN: 6.5 g/dL (ref 6.5–8.1)

## 2018-03-26 LAB — CBC
HEMATOCRIT: 32.2 % — AB (ref 36.0–46.0)
HEMOGLOBIN: 10.6 g/dL — AB (ref 12.0–15.0)
MCH: 28 pg (ref 26.0–34.0)
MCHC: 32.9 g/dL (ref 30.0–36.0)
MCV: 85.2 fL (ref 80.0–100.0)
Platelets: 130 10*3/uL — ABNORMAL LOW (ref 150–400)
RBC: 3.78 MIL/uL — ABNORMAL LOW (ref 3.87–5.11)
RDW: 13.7 % (ref 11.5–15.5)
WBC: 6.9 10*3/uL (ref 4.0–10.5)
nRBC: 0 % (ref 0.0–0.2)

## 2018-03-26 LAB — PROTEIN / CREATININE RATIO, URINE
CREATININE, URINE: 96 mg/dL
PROTEIN CREATININE RATIO: 0.16 mg/mg{creat} — AB (ref 0.00–0.15)
TOTAL PROTEIN, URINE: 15 mg/dL

## 2018-03-26 NOTE — Discharge Instructions (Signed)
Round Ligament Pain  The round ligament is a cord of muscle and tissue that helps support the uterus. It can become a source of pain during pregnancy if it becomes stretched or twisted as the baby grows. The pain usually begins in the second trimester (13-28 weeks) of pregnancy, and it can come and go until the baby is delivered. It is not a serious problem, and it does not cause harm to the baby. Round ligament pain is usually a short, sharp, and pinching pain, but it can also be a dull, lingering, and aching pain. The pain is felt in the lower side of the abdomen or in the groin. It usually starts deep in the groin and moves up to the outside of the hip area. The pain may occur when you:  Suddenly change position, such as quickly going from a sitting to standing position.  Roll over in bed.  Cough or sneeze.  Do physical activity. Follow these instructions at home:   Watch your condition for any changes.  When the pain starts, relax. Then try any of these methods to help with the pain: ? Sitting down. ? Flexing your knees up to your abdomen. ? Lying on your side with one pillow under your abdomen and another pillow between your legs. ? Sitting in a warm bath for 15-20 minutes or until the pain goes away.  Take over-the-counter and prescription medicines only as told by your health care provider.  Move slowly when you sit down or stand up.  Avoid long walks if they cause pain.  Stop or reduce your physical activities if they cause pain.  Keep all follow-up visits as told by your health care provider. This is important. Contact a health care provider if:  Your pain does not go away with treatment.  You feel pain in your back that you did not have before.  Your medicine is not helping. Get help right away if:  You have a fever or chills.  You develop uterine contractions.  You have vaginal bleeding.  You have nausea or vomiting.  You have diarrhea.  You have pain  when you urinate. Summary  Round ligament pain is felt in the lower abdomen or groin. It is usually a short, sharp, and pinching pain. It can also be a dull, lingering, and aching pain.  This pain usually begins in the second trimester (13-28 weeks). It occurs because the uterus is stretching with the growing baby, and it is not harmful to the baby.  You may notice the pain when you suddenly change position, when you cough or sneeze, or during physical activity.  Relaxing, flexing your knees to your abdomen, lying on one side, or taking a warm bath may help to get rid of the pain.  Get help from your health care provider if the pain does not go away or if you have vaginal bleeding, nausea, vomiting, diarrhea, or painful urination. This information is not intended to replace advice given to you by your health care provider. Make sure you discuss any questions you have with your health care provider. Document Released: 12/05/2007 Document Revised: 08/13/2017 Document Reviewed: 08/13/2017 Elsevier Interactive Patient Education  2019 [employer] Inc[employer] Vaginal Delivery  Vaginal delivery means that you give birth by pushing your baby out of your birth canal (vagina). A team of health care providers will help you before, during, and after vaginal delivery. Birth experiences are unique for every woman and every pregnancy, and birth experiences vary depending on where  you choose to give birth. What happens when I arrive at the birth center or hospital? Once you are in labor and have been admitted into the hospital or birth center, your health care provider may:  Review your pregnancy history and any concerns that you have.  Insert an IV into one of your veins. This may be used to give you fluids and medicines.  Check your blood pressure, pulse, temperature, and heart rate (vital signs).  Check whether your bag of water (amniotic sac) has broken (ruptured).  Talk with you about your birth plan and  discuss pain control options. Monitoring Your health care provider may monitor your contractions (uterine monitoring) and your baby's heart rate (fetal monitoring). You may need to be monitored:  Often, but not continuously (intermittently).  All the time or for long periods at a time (continuously). Continuous monitoring may be needed if: ? You are taking certain medicines, such as medicine to relieve pain or make your contractions stronger. ? You have pregnancy or labor complications. Monitoring may be done by:  Placing a special stethoscope or a handheld monitoring device on your abdomen to check your baby's heartbeat and to check for contractions.  Placing monitors on your abdomen (external monitors) to record your baby's heartbeat and the frequency and length of contractions.  Placing monitors inside your uterus through your vagina (internal monitors) to record your baby's heartbeat and the frequency, length, and strength of your contractions. Depending on the type of monitor, it may remain in your uterus or on your baby's head until birth.  Telemetry. This is a type of continuous monitoring that can be done with external or internal monitors. Instead of having to stay in bed, you are able to move around during telemetry. Physical exam Your health care provider may perform frequent physical exams. This may include:  Checking how and where your baby is positioned in your uterus.  Checking your cervix to determine: ? Whether it is thinning out (effacing). ? Whether it is opening up (dilating). What happens during labor and delivery?  Normal labor and delivery is divided into the following three stages: Stage 1  This is the longest stage of labor.  This stage can last for hours or days.  Throughout this stage, you will feel contractions. Contractions generally feel mild, infrequent, and irregular at first. They get stronger, more frequent (about every 2-3 minutes), and more  regular as you move through this stage.  This stage ends when your cervix is completely dilated to 4 inches (10 cm) and completely effaced. Stage 2  This stage starts once your cervix is completely effaced and dilated and lasts until the delivery of your baby.  This stage may last from 20 minutes to 2 hours.  This is the stage where you will feel an urge to push your baby out of your vagina.  You may feel stretching and burning pain, especially when the widest part of your baby's head passes through the vaginal opening (crowning).  Once your baby is delivered, the umbilical cord will be clamped and cut. This usually occurs after waiting a period of 1-2 minutes after delivery.  Your baby will be placed on your bare chest (skin-to-skin contact) in an upright position and covered with a warm blanket. Watch your baby for feeding cues, like rooting or sucking, and help the baby to your breast for his or her first feeding. Stage 3  This stage starts immediately after the birth of your baby and ends after  you deliver the placenta.  This stage may take anywhere from 5 to 30 minutes.  After your baby has been delivered, you will feel contractions as your body expels the placenta and your uterus contracts to control bleeding. What can I expect after labor and delivery?  After labor is over, you and your baby will be monitored closely until you are ready to go home to ensure that you are both healthy. Your health care team will teach you how to care for yourself and your baby.  You and your baby will stay in the same room (rooming in) during your hospital stay. This will encourage early bonding and successful breastfeeding.  You may continue to receive fluids and medicines through an IV.  Your uterus will be checked and massaged regularly (fundal massage).  You will have some soreness and pain in your abdomen, vagina, and the area of skin between your vaginal opening and your anus  (perineum).  If an incision was made near your vagina (episiotomy) or if you had some vaginal tearing during delivery, cold compresses may be placed on your episiotomy or your tear. This helps to reduce pain and swelling.  You may be given a squirt bottle to use instead of wiping when you go to the bathroom. To use the squirt bottle, follow these steps: ? Before you urinate, fill the squirt bottle with warm water. Do not use hot water. ? After you urinate, while you are sitting on the toilet, use the squirt bottle to rinse the area around your urethra and vaginal opening. This rinses away any urine and blood. ? Fill the squirt bottle with clean water every time you use the bathroom.  It is normal to have vaginal bleeding after delivery. Wear a sanitary pad for vaginal bleeding and discharge. Summary  Vaginal delivery means that you will give birth by pushing your baby out of your birth canal (vagina).  Your health care provider may monitor your contractions (uterine monitoring) and your baby's heart rate (fetal monitoring).  Your health care provider may perform a physical exam.  Normal labor and delivery is divided into three stages.  After labor is over, you and your baby will be monitored closely until you are ready to go home. This information is not intended to replace advice given to you by your health care provider. Make sure you discuss any questions you have with your health care provider. Document Released: 12/05/2007 Document Revised: 04/01/2017 Document Reviewed: 04/01/2017 Elsevier Interactive Patient Education  2019 [employer]

## 2018-03-26 NOTE — MAU Note (Addendum)
Pt arrived via EMS. Patient states that around 0430 " baby was moving a lot. She stopped moving and then it started aching on the bottom right side."  Denies LOF or bleeding. +FM

## 2018-03-26 NOTE — MAU Provider Note (Signed)
Chief Complaint:  Abdominal Pain   Provider saw patient at 0555   HPI: Taylor Poole is a 18 y.o. G1P0 at 64w2dwho presents via EMS  to maternity admissions reporting dull pain in right lower quadrant.  States it "aches".  States it woke her up.   She reports good fetal movement, denies LOF, vaginal bleeding, vaginal itching/burning, urinary symptoms, h/a, dizziness, n/v, diarrhea, constipation or fever/chills.  She denies headache, visual changes or RUQ abdominal pain.  Abdominal Pain  This is a new problem. The current episode started today. The onset quality is sudden. The problem occurs intermittently. The problem has been waxing and waning. The pain is located in the RLQ. The pain is mild. The quality of the pain is aching. The abdominal pain does not radiate. Pertinent negatives include no constipation, diarrhea, dysuria, fever, headaches, myalgias, nausea or vomiting. Nothing aggravates the pain. The pain is relieved by nothing. She has tried nothing for the symptoms.   RN Note: Pt arrived via EMS. Patient states that around 0430 " baby was moving a lot. She stopped moving and then it started aching on the bottom right side."  Denies LOF or bleeding. +FM   Past Medical History: Past Medical History:  Diagnosis Date  . Dehydration 10/09/2016  . Medical history non-contributory     Past obstetric history: OB History  Gravida Para Term Preterm AB Living  1            SAB TAB Ectopic Multiple Live Births               # Outcome Date GA Lbr Len/2nd Weight Sex Delivery Anes PTL Lv  1 Current             Past Surgical History: Past Surgical History:  Procedure Laterality Date  . NO PAST SURGERIES      Family History: No family history on file.  Social History: Social History   Tobacco Use  . Smoking status: Never Smoker  . Smokeless tobacco: Never Used  Substance Use Topics  . Alcohol use: No  . Drug use: Not Currently    Types: Marijuana    Allergies: No Known  Allergies  Meds:  Medications Prior to Admission  Medication Sig Dispense Refill Last Dose  . ondansetron (ZOFRAN ODT) 4 MG disintegrating tablet Take 1 tablet (4 mg total) by mouth every 6 (six) hours as needed for nausea. (Patient not taking: Reported on 10/30/2017) 30 tablet 2 Not Taking  . Prenatal Vit-Fe Fumarate-FA (PRENATAL COMPLETE) 14-0.4 MG TABS Take 1 tablet by mouth daily. (Patient not taking: Reported on 02/12/2018) 60 each 0 Not Taking  . promethazine (PHENERGAN) 25 MG tablet Take 1 tablet (25 mg total) by mouth every 6 (six) hours as needed. (Patient not taking: Reported on 09/24/2017) 12 tablet 0 Not Taking    I have reviewed patient's Past Medical Hx, Surgical Hx, Family Hx, Social Hx, medications and allergies.   ROS:  Review of Systems  Constitutional: Negative for fever.  Gastrointestinal: Positive for abdominal pain. Negative for constipation, diarrhea, nausea and vomiting.  Genitourinary: Negative for dysuria.  Musculoskeletal: Negative for myalgias.  Neurological: Negative for headaches.   Other systems negative  Physical Exam   Patient Vitals for the past 24 hrs:  BP Temp Temp src Pulse Resp SpO2  03/26/18 0645 118/76 - - 60 - -  03/26/18 0631 126/80 - - 63 - -  03/26/18 0619 (!) 142/93 - - 60 - -  03/26/18 0553 131/88 97.8  F (36.6 C) Oral 65 20 100 %   Vitals:   03/26/18 0619 03/26/18 0631 03/26/18 0645 03/26/18 0700  BP: (!) 142/93 126/80 118/76 130/83  Pulse: 60 63 60 62  Resp:      Temp:      TempSrc:      SpO2:        Constitutional: Well-developed, well-nourished female in no acute distress. Appears uncomfortable with movement or walking. But not feeling contractions.  Cardiovascular: normal rate and rhythm Respiratory: normal effort, clear to auscultation bilaterally GI: Abd soft, non-tender, gravid appropriate for gestational age.   No rebound or guarding. MS: Extremities nontender, no edema, normal ROM Neurologic: Alert and oriented x 4.     DTRs normal  GU: Neg CVAT.  PELVIC EXAM:  Dilation: 1 Effacement (%): 50 Cervical Position: Posterior Station: Web designerBallotable, -3 Presentation: Vertex Exam by:: Artelia LarocheM Liam Bossman, CMN  FHT:  Baseline 135 , moderate variability, accelerations present, no decelerations Contractions: q 3-5 mins Irregular     Labs: Results for orders placed or performed during the hospital encounter of 03/26/18 (from the past 24 hour(s))  Urinalysis, Routine w reflex microscopic     Status: Abnormal   Collection Time: 03/26/18  6:14 AM  Result Value Ref Range   Color, Urine YELLOW YELLOW   APPearance CLEAR CLEAR   Specific Gravity, Urine 1.012 1.005 - 1.030   pH 6.0 5.0 - 8.0   Glucose, UA NEGATIVE NEGATIVE mg/dL   Hgb urine dipstick NEGATIVE NEGATIVE   Bilirubin Urine NEGATIVE NEGATIVE   Ketones, ur NEGATIVE NEGATIVE mg/dL   Protein, ur NEGATIVE NEGATIVE mg/dL   Nitrite NEGATIVE NEGATIVE   Leukocytes, UA MODERATE (A) NEGATIVE   RBC / HPF 0-5 0 - 5 RBC/hpf   WBC, UA 0-5 0 - 5 WBC/hpf   Bacteria, UA NONE SEEN NONE SEEN   Squamous Epithelial / LPF 6-10 0 - 5   Mucus PRESENT   Protein / creatinine ratio, urine     Status: Abnormal   Collection Time: 03/26/18  6:14 AM  Result Value Ref Range   Creatinine, Urine 96.00 mg/dL   Total Protein, Urine 15 mg/dL   Protein Creatinine Ratio 0.16 (H) 0.00 - 0.15 mg/mg[Cre]  CBC     Status: Abnormal   Collection Time: 03/26/18  6:35 AM  Result Value Ref Range   WBC 6.9 4.0 - 10.5 K/uL   RBC 3.78 (L) 3.87 - 5.11 MIL/uL   Hemoglobin 10.6 (L) 12.0 - 15.0 g/dL   HCT 98.132.2 (L) 19.136.0 - 47.846.0 %   MCV 85.2 80.0 - 100.0 fL   MCH 28.0 26.0 - 34.0 pg   MCHC 32.9 30.0 - 36.0 g/dL   RDW 29.513.7 62.111.5 - 30.815.5 %   Platelets 130 (L) 150 - 400 K/uL   nRBC 0.0 0.0 - 0.2 %  Comprehensive metabolic panel     Status: Abnormal   Collection Time: 03/26/18  6:35 AM  Result Value Ref Range   Sodium 132 (L) 135 - 145 mmol/L   Potassium 3.9 3.5 - 5.1 mmol/L   Chloride 105 98 - 111 mmol/L    CO2 20 (L) 22 - 32 mmol/L   Glucose, Bld 85 70 - 99 mg/dL   BUN 8 6 - 20 mg/dL   Creatinine, Ser 6.570.70 0.44 - 1.00 mg/dL   Calcium 8.6 (L) 8.9 - 10.3 mg/dL   Total Protein 6.5 6.5 - 8.1 g/dL   Albumin 3.1 (L) 3.5 - 5.0 g/dL   AST 16  15 - 41 U/L   ALT 13 0 - 44 U/L   Alkaline Phosphatase 97 38 - 126 U/L   Total Bilirubin 0.5 0.3 - 1.2 mg/dL   GFR calc non Af Amer >60 >60 mL/min   GFR calc Af Amer >60 >60 mL/min   Anion gap 7 5 - 15    --/--/A POS, A POS Performed at [facility], [address] [place], [place] [address]  [phone])  Imaging:  No results found.  MAU Course/MDM: I have ordered labs and reviewed results. Added Preeclampsia labs due to transient elevation of BP (later normalized) NST reviewed, reactive with irregular painless contractions.  Labs all normal BPs normal after initial elevation Treatments in MAU included EFM.    Assessment: 1. GBS bacteriuria   2. Encounter for supervision of normal first pregnancy in third trimester   3.     Labile hypertension 4.     Round ligament pain 5.      Braxton Hicks contractions   Plan: Discharge home Labor precautions and fetal kick counts Follow up in Office for prenatal visits and recheck  Encouraged to return here or to other Urgent Care/ED if she develops worsening of symptoms, increase in pain, fever, or other concerning symptoms.   Pt stable at time of discharge.  Wynelle Bourgeois CNM, MSN Certified Nurse-Midwife 03/26/2018 6:58 AM

## 2018-04-02 ENCOUNTER — Encounter: Payer: Medicaid Other | Admitting: Obstetrics and Gynecology

## 2018-04-03 ENCOUNTER — Other Ambulatory Visit: Payer: Self-pay

## 2018-04-03 ENCOUNTER — Inpatient Hospital Stay (HOSPITAL_COMMUNITY)
Admission: AD | Admit: 2018-04-03 | Discharge: 2018-04-05 | DRG: 805 | Disposition: A | Discharge status: Home / Self Care | Payer: Medicaid Other | Attending: Obstetrics and Gynecology | Admitting: Obstetrics and Gynecology

## 2018-04-03 ENCOUNTER — Inpatient Hospital Stay (HOSPITAL_COMMUNITY): Payer: Medicaid Other | Admitting: Anesthesiology

## 2018-04-03 ENCOUNTER — Encounter (HOSPITAL_COMMUNITY): Payer: Self-pay

## 2018-04-03 DIAGNOSIS — O4292 Full-term premature rupture of membranes, unspecified as to length of time between rupture and onset of labor: Principal | ICD-10-CM | POA: Diagnosis present

## 2018-04-03 DIAGNOSIS — Z3A39 39 weeks gestation of pregnancy: Secondary | ICD-10-CM | POA: Diagnosis not present

## 2018-04-03 DIAGNOSIS — Z3403 Encounter for supervision of normal first pregnancy, third trimester: Secondary | ICD-10-CM

## 2018-04-03 DIAGNOSIS — O99824 Streptococcus B carrier state complicating childbirth: Secondary | ICD-10-CM | POA: Diagnosis present

## 2018-04-03 DIAGNOSIS — O411230 Chorioamnionitis, third trimester, not applicable or unspecified: Secondary | ICD-10-CM | POA: Diagnosis present

## 2018-04-03 DIAGNOSIS — O4290 Premature rupture of membranes, unspecified as to length of time between rupture and onset of labor, unspecified weeks of gestation: Secondary | ICD-10-CM | POA: Diagnosis present

## 2018-04-03 DIAGNOSIS — R8271 Bacteriuria: Secondary | ICD-10-CM

## 2018-04-03 DIAGNOSIS — O4202 Full-term premature rupture of membranes, onset of labor within 24 hours of rupture: Secondary | ICD-10-CM | POA: Diagnosis not present

## 2018-04-03 HISTORY — DX: Depression, unspecified: F32.A

## 2018-04-03 HISTORY — DX: Major depressive disorder, single episode, unspecified: F32.9

## 2018-04-03 LAB — COMPREHENSIVE METABOLIC PANEL
ALT: 12 U/L (ref 0–44)
AST: 17 U/L (ref 15–41)
Albumin: 3.3 g/dL — ABNORMAL LOW (ref 3.5–5.0)
Alkaline Phosphatase: 119 U/L (ref 38–126)
Anion gap: 7 (ref 5–15)
BUN: 10 mg/dL (ref 6–20)
CO2: 20 mmol/L — ABNORMAL LOW (ref 22–32)
Calcium: 8.8 mg/dL — ABNORMAL LOW (ref 8.9–10.3)
Chloride: 107 mmol/L (ref 98–111)
Creatinine, Ser: 0.77 mg/dL (ref 0.44–1.00)
GFR calc Af Amer: >60 mL/min (ref >60)
Glucose, Bld: 86 mg/dL (ref 70–99)
Potassium: 4.1 mmol/L (ref 3.5–5.1)
Sodium: 134 mmol/L — ABNORMAL LOW (ref 135–145)
TOTAL PROTEIN: 6.7 g/dL (ref 6.5–8.1)
Total Bilirubin: 0.4 mg/dL (ref 0.3–1.2)

## 2018-04-03 LAB — CBC
HCT: 32.6 % — ABNORMAL LOW (ref 36.0–46.0)
Hemoglobin: 10.7 g/dL — ABNORMAL LOW (ref 12.0–15.0)
MCH: 27.9 pg (ref 26.0–34.0)
MCHC: 32.8 g/dL (ref 30.0–36.0)
MCV: 85.1 fL (ref 80.0–100.0)
Platelets: 132 10*3/uL — ABNORMAL LOW (ref 150–400)
RBC: 3.83 MIL/uL — ABNORMAL LOW (ref 3.87–5.11)
RDW: 13.8 % (ref 11.5–15.5)
WBC: 9.4 10*3/uL (ref 4.0–10.5)
nRBC: 0 % (ref 0.0–0.2)

## 2018-04-03 LAB — RPR: RPR Ser Ql: NONREACTIVE

## 2018-04-03 LAB — PROTEIN / CREATININE RATIO, URINE
CREATININE, URINE: 132 mg/dL
Protein Creatinine Ratio: 0.53 mg/mg{Cre} — ABNORMAL HIGH (ref 0.00–0.15)
Total Protein, Urine: 70 mg/dL

## 2018-04-03 LAB — TYPE AND SCREEN
ABO/RH(D): A POS
Antibody Screen: NEGATIVE

## 2018-04-03 LAB — POCT FERN TEST: POCT FERN TEST: POSITIVE

## 2018-04-03 MED ORDER — DIPHENHYDRAMINE HCL 50 MG/ML IJ SOLN: 12.5000 mg | INTRAMUSCULAR | Status: DC | PRN

## 2018-04-03 MED ORDER — PENICILLIN G 3 MILLION UNITS IVPB - SIMPLE MED
3.0000 10*6.[IU] | INTRAVENOUS | Status: DC
  Administered 2018-04-03 (×2): 3 10*6.[IU] via INTRAVENOUS
  Filled 2018-04-03 (×2): qty 100

## 2018-04-03 MED ORDER — TETANUS-DIPHTH-ACELL PERTUSSIS 5-2.5-18.5 LF-MCG/0.5 IM SUSP: 0.5000 mL | Freq: Once | INTRAMUSCULAR | Status: DC

## 2018-04-03 MED ORDER — OXYTOCIN BOLUS FROM INFUSION: 500.0000 mL | Freq: Once | INTRAVENOUS | Status: DC

## 2018-04-03 MED ORDER — PHENYLEPHRINE 40 MCG/ML (10ML) SYRINGE FOR IV PUSH (FOR BLOOD PRESSURE SUPPORT)
80.0000 ug | PREFILLED_SYRINGE | INTRAVENOUS | Status: DC | PRN
  Filled 2018-04-03 (×2): qty 10

## 2018-04-03 MED ORDER — SENNOSIDES-DOCUSATE SODIUM 8.6-50 MG PO TABS
2.0000 | ORAL_TABLET | ORAL | Status: DC
  Administered 2018-04-03 – 2018-04-04 (×2): 2 via ORAL
  Filled 2018-04-03 (×2): qty 2

## 2018-04-03 MED ORDER — WITCH HAZEL-GLYCERIN EX PADS: 1.0000 "application " | MEDICATED_PAD | CUTANEOUS | Status: DC | PRN

## 2018-04-03 MED ORDER — EPHEDRINE 5 MG/ML INJ
10.0000 mg | INTRAVENOUS | Status: DC | PRN
  Filled 2018-04-03: qty 2

## 2018-04-03 MED ORDER — LIDOCAINE HCL (PF) 1 % IJ SOLN
30.0000 mL | INTRAMUSCULAR | Status: DC | PRN
  Filled 2018-04-03: qty 30

## 2018-04-03 MED ORDER — MISOPROSTOL 50MCG HALF TABLET
50.0000 ug | ORAL_TABLET | ORAL | Status: DC
  Administered 2018-04-03: 50 ug via ORAL
  Filled 2018-04-03 (×6): qty 1

## 2018-04-03 MED ORDER — BENZOCAINE-MENTHOL 20-0.5 % EX AERO: 1.0000 "application " | INHALATION_SPRAY | CUTANEOUS | Status: DC | PRN

## 2018-04-03 MED ORDER — DIBUCAINE 1 % RE OINT: 1.0000 "application " | TOPICAL_OINTMENT | RECTAL | Status: DC | PRN

## 2018-04-03 MED ORDER — LIDOCAINE HCL (PF) 1 % IJ SOLN
INTRAMUSCULAR | Status: DC | PRN
  Administered 2018-04-03 (×2): 4 mL via EPIDURAL

## 2018-04-03 MED ORDER — FENTANYL 2.5 MCG/ML BUPIVACAINE 1/10 % EPIDURAL INFUSION (WH - ANES)
14.0000 mL/h | INTRAMUSCULAR | Status: DC | PRN
  Administered 2018-04-03: 14 mL/h via EPIDURAL
  Filled 2018-04-03: qty 100

## 2018-04-03 MED ORDER — ONDANSETRON HCL 4 MG/2ML IJ SOLN
4.0000 mg | Freq: Four times a day (QID) | INTRAMUSCULAR | Status: DC | PRN
  Administered 2018-04-03: 4 mg via INTRAVENOUS
  Filled 2018-04-03: qty 2

## 2018-04-03 MED ORDER — LACTATED RINGERS IV SOLN: 500.0000 mL | Freq: Once | INTRAVENOUS | Status: DC

## 2018-04-03 MED ORDER — OXYTOCIN 40 UNITS IN NORMAL SALINE INFUSION - SIMPLE MED
2.5000 [IU]/h | INTRAVENOUS | Status: DC
  Administered 2018-04-03: 2.5 [IU]/h via INTRAVENOUS

## 2018-04-03 MED ORDER — SIMETHICONE 80 MG PO CHEW: 80.0000 mg | CHEWABLE_TABLET | ORAL | Status: DC | PRN

## 2018-04-03 MED ORDER — LACTATED RINGERS IV SOLN
INTRAVENOUS | Status: DC
  Administered 2018-04-03 (×3): via INTRAVENOUS

## 2018-04-03 MED ORDER — DIPHENHYDRAMINE HCL 25 MG PO CAPS: 25.0000 mg | ORAL_CAPSULE | Freq: Four times a day (QID) | ORAL | Status: DC | PRN

## 2018-04-03 MED ORDER — ONDANSETRON HCL 4 MG PO TABS: 4.0000 mg | ORAL_TABLET | ORAL | Status: DC | PRN

## 2018-04-03 MED ORDER — ONDANSETRON HCL 4 MG/2ML IJ SOLN: 4.0000 mg | INTRAMUSCULAR | Status: DC | PRN

## 2018-04-03 MED ORDER — ACETAMINOPHEN 325 MG PO TABS: 650.0000 mg | ORAL_TABLET | ORAL | Status: DC | PRN

## 2018-04-03 MED ORDER — OXYTOCIN 40 UNITS IN NORMAL SALINE INFUSION - SIMPLE MED
1.0000 m[IU]/min | INTRAVENOUS | Status: DC
  Administered 2018-04-03: 2 m[IU]/min via INTRAVENOUS
  Filled 2018-04-03: qty 1000

## 2018-04-03 MED ORDER — OXYCODONE-ACETAMINOPHEN 5-325 MG PO TABS: 1.0000 | ORAL_TABLET | ORAL | Status: DC | PRN

## 2018-04-03 MED ORDER — TERBUTALINE SULFATE 1 MG/ML IJ SOLN
0.2500 mg | Freq: Once | INTRAMUSCULAR | Status: DC | PRN
  Filled 2018-04-03: qty 1

## 2018-04-03 MED ORDER — COCONUT OIL OIL
1.0000 "application " | TOPICAL_OIL | Status: DC | PRN
  Filled 2018-04-03: qty 120

## 2018-04-03 MED ORDER — GENTAMICIN SULFATE 40 MG/ML IJ SOLN
5.0000 mg/kg | INTRAVENOUS | Status: DC
  Filled 2018-04-03: qty 7.75

## 2018-04-03 MED ORDER — IBUPROFEN 600 MG PO TABS
600.0000 mg | ORAL_TABLET | Freq: Four times a day (QID) | ORAL | Status: DC
  Administered 2018-04-03 – 2018-04-05 (×8): 600 mg via ORAL
  Filled 2018-04-03 (×8): qty 1

## 2018-04-03 MED ORDER — PRENATAL MULTIVITAMIN CH
1.0000 | ORAL_TABLET | Freq: Every day | ORAL | Status: DC
  Administered 2018-04-04 – 2018-04-05 (×2): 1 via ORAL
  Filled 2018-04-03 (×2): qty 1

## 2018-04-03 MED ORDER — SOD CITRATE-CITRIC ACID 500-334 MG/5ML PO SOLN: 30.0000 mL | ORAL | Status: DC | PRN

## 2018-04-03 MED ORDER — OXYCODONE-ACETAMINOPHEN 5-325 MG PO TABS: 2.0000 | ORAL_TABLET | ORAL | Status: DC | PRN

## 2018-04-03 MED ORDER — SODIUM CHLORIDE 0.9 % IV SOLN
3.0000 g | Freq: Four times a day (QID) | INTRAVENOUS | Status: DC
  Filled 2018-04-03 (×2): qty 3

## 2018-04-03 MED ORDER — ZOLPIDEM TARTRATE 5 MG PO TABS: 5.0000 mg | ORAL_TABLET | Freq: Every evening | ORAL | Status: DC | PRN

## 2018-04-03 MED ORDER — MEASLES, MUMPS & RUBELLA VAC IJ SOLR: 0.5000 mL | Freq: Once | INTRAMUSCULAR | Status: DC

## 2018-04-03 MED ORDER — SODIUM CHLORIDE 0.9 % IV SOLN
5.0000 10*6.[IU] | Freq: Once | INTRAVENOUS | Status: AC
  Administered 2018-04-03: 5 10*6.[IU] via INTRAVENOUS
  Filled 2018-04-03: qty 5

## 2018-04-03 MED ORDER — LACTATED RINGERS IV SOLN
500.0000 mL | INTRAVENOUS | Status: DC | PRN
  Administered 2018-04-03: 500 mL via INTRAVENOUS

## 2018-04-03 MED ORDER — FENTANYL CITRATE (PF) 100 MCG/2ML IJ SOLN
100.0000 ug | INTRAMUSCULAR | Status: DC | PRN
  Administered 2018-04-03 (×2): 100 ug via INTRAVENOUS
  Filled 2018-04-03 (×2): qty 2

## 2018-04-03 NOTE — Progress Notes (Signed)
Pharmacy Antibiotic Note  Taylor Poole is a 18 y.o. female admitted on 04/03/2018 with chorioamnionitis.  Pharmacy has been consulted for unasyn and gentamicin dosing.  Plan: Will start Unasyn 3gm Q6H and gentamicin 5 mg/kg Q24H. Check serum creatinine and gentamicin levels if continued for longer than 3 days.   Height: 5\' 2"  (157.5 cm) Weight: 171 lb 1.9 oz (77.6 kg) IBW/kg (Calculated) : 50.1  Temp (24hrs), Avg:98.9 F (37.2 C), Min:97.9 F (36.6 C), Max:100.8 F (38.2 C)  Recent Labs  Lab 04/03/18 0510  WBC 9.4  CREATININE 0.77    Estimated Creatinine Clearance: 109.1 mL/min (by C-G formula based on SCr of 0.77 mg/dL).    No Known Allergies  Antimicrobials this admission: Pencillin 5 Million Units x1 dose Penicillin 3 Million Units Q4H 1/24 >> 1/24 Gentamicin 5mg /kg (310mg ) Q24H 1/24 >> Unasyn 3gm Q6H 1/24 >>  Microbiology results: none  Thank you for allowing pharmacy to be a part of this patient's care.  Derwood KaplanSarah K Marilyne Haseley 04/03/2018 2:30 PM
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
## 2018-04-03 NOTE — Discharge Summary (Addendum)
Obstetrics Discharge Summary OB/GYN Faculty Practice   Patient Name: Taylor Poole DOB: 2001/01/11 MRN: [id number]  Date of admission: 04/03/2018 Delivering MD: Dollene Cleveland   Date of discharge: 04/05/2018  Admitting diagnosis: 39WKS WATER BROKE Intrauterine pregnancy: [redacted]w[redacted]d     Secondary diagnosis:   Active Problems:   GBS bacteriuria   PROM (premature rupture of membranes)   Additional problems:  . Triple I     Discharge diagnosis: Term Pregnancy Delivered                                            Postpartum procedures: None  Complications: None  Outpatient Follow-Up: -follow up in 4 weeks with outpatient provider  Hospital course: Taylor Poole is a 18 y.o. [redacted]w[redacted]d who was admitted for PROM. Her pregnancy was uncomplicated. Her labor course was notable for cook catheter for cervical dilation and labor advancement. Delivery was uncomplicated. Please see delivery/op note for additional details. Her postpartum course was uncomplicated. By day of discharge, she was passing flatus, urinating, eating and drinking without difficulty. Her pain was well-controlled, and she was discharged home. She will follow-up in clinic in 4 weeks.   Physical exam  Vitals:   04/04/18 0520 04/04/18 1416 04/04/18 2100 04/05/18 0528  BP: 119/75 114/79 118/70 121/84  Pulse: 65 76 69 75  Resp: 18 17 16 16   Temp: 98.6 F (37 C) 98.8 F (37.1 C) 97.8 F (36.6 C) 97.7 F (36.5 C)  TempSrc: Oral Oral Oral Oral  SpO2: 100% 98%  99%  Weight:      Height:       General: alert and cooperative  Lochia: appropriate Uterine Fundus: firm Incision: N/A DVT Evaluation: No evidence of DVT seen on physical exam. Labs: Lab Results  Component Value Date   WBC 9.4 04/03/2018   HGB 10.7 (L) 04/03/2018   HCT 32.6 (L) 04/03/2018   MCV 85.1 04/03/2018   PLT 132 (L) 04/03/2018   CMP Latest Ref Rng & Units 04/03/2018  Glucose 70 - 99 mg/dL 86  BUN 6 - 20 mg/dL 10  Creatinine 6.73 - 4.19 mg/dL 3.79   Sodium 024 - 097 mmol/L 134(L)  Potassium 3.5 - 5.1 mmol/L 4.1  Chloride 98 - 111 mmol/L 107  CO2 22 - 32 mmol/L 20(L)  Calcium 8.9 - 10.3 mg/dL 3.5(H)  Total Protein 6.5 - 8.1 g/dL 6.7  Total Bilirubin 0.3 - 1.2 mg/dL 0.4  Alkaline Phos 38 - 126 U/L 119  AST 15 - 41 U/L 17  ALT 0 - 44 U/L 12    Discharge instructions: Per After Visit Summary and "Baby and Me Booklet"  After visit meds:  Allergies as of 04/05/2018   No Known Allergies     Medication List    TAKE these medications   ibuprofen 600 MG tablet Commonly known as:  ADVIL,MOTRIN Take 1 tablet (600 mg total) by mouth every 6 (six) hours.   prenatal multivitamin Tabs tablet Take 1 tablet by mouth daily at 12 noon for 30 days.   witch hazel-glycerin pad Commonly known as:  TUCKS Apply 1 application topically as needed for hemorrhoids.       Postpartum contraception: Undecided Diet: Routine Diet Activity: Advance as tolerated. Pelvic rest for 6 weeks.   Follow-up Appt: Future Appointments  Date Time Provider Department Center  04/10/2018 11:15 AM Tilda Burrow, MD [facility]  Follow-up Visit:No follow-ups on file.   Newborn Delivery   Birth date/time:   Delivery type:       Baby Feeding: Bottle and Breast Disposition:home with mother  Rollene Rotunda, DO [facility]

## 2018-04-03 NOTE — Lactation Note (Signed)
This note was copied from a baby's chart. Lactation Consultation Note  Patient Name: Taylor Poole [id number] Date: 04/03/2018 Reason for consult: Initial assessment;1st time breastfeeding;Term P1, 8 hour female infant. Per mom, she only plans to breastfeed for 3 days while in hospital. Mom is aware of breastfeeding benefits  to herself and infant and active on [facility] program in [place]. LC mention to mom that LC is here to support her feeding choice and  decisions. Per mom, she doesn't have breast pump at home. LC gave mom "harmony hand pump" explained how to assemble, re-assemble and clean pump parts.  Mom feeding choice at admission is breast and formula feeding. Mom demonstrated hand expression and infant was given 5 ml of EBM by spoon. LC notice that mom has semi short shafted nipples , breast shells given and LC explained mom to wear during the day and not to sleep in them at night. Mom latched infant on right breast using cross cradle hold, infant latched with easily, sucks and swallows observed. Infant breastfeed for 15 minutes. Mom was still breastfeeding when [facility] left room. Mom knows to breastfeed infant according hunger cues and not exceed 3 hours without breastfeeding infant. LC discussed I & O. Reviewed Baby & Me book's Breastfeeding Basics.  Mom made aware of O/P services, breastfeeding support groups, community resources, and our phone # for post-discharge questions.  Maternal Data Formula Feeding for Exclusion: Yes Reason for exclusion: Mother's choice to formula and breast feed on admission Has patient been taught Hand Expression?: Yes(Mom easily hand expressed 39ml of EBM that was spoon feed to infant. ) Does the patient have breastfeeding experience prior to this delivery?: No  Feeding Feeding Type: Breast Fed  LATCH Score Latch: Grasps breast easily, tongue down, lips flanged, rhythmical sucking.  Audible Swallowing: Spontaneous and intermittent  Type of  Nipple: Everted at rest and after stimulation(short shafted breastshells given)  Comfort (Breast/Nipple): Soft / non-tender  Hold (Positioning): Assistance needed to correctly position infant at breast and maintain latch.  LATCH Score: 9  Interventions Interventions: Breast feeding basics reviewed;Assisted with latch;Skin to skin;Breast compression;Breast massage;Hand express;Support pillows;Adjust position;Shells;Hand pump  Lactation Tools Discussed/Used WIC Program: Yes Pump Review: Setup, frequency, and cleaning;Milk Storage Initiated by:: [name], IBCLC Date initiated:: 04/03/18   Consult Status Consult Status: Follow-up Date: 04/04/18 Follow-up type: In-patient    [name] 04/03/2018, 11:00 PM

## 2018-04-03 NOTE — Anesthesia Procedure Notes (Signed)

## 2018-04-03 NOTE — Anesthesia Preprocedure Evaluation (Signed)
Anesthesia Evaluation  Patient identified by MRN, date of birth, ID band Patient awake    Reviewed: Allergy & Precautions, Patient's Chart, lab work & pertinent test results  History of Anesthesia Complications Negative for: history of anesthetic complications  Airway Mallampati: II  TM Distance: >3 FB Neck ROM: Full    Dental  (+) Teeth Intact, Dental Advisory Given Tongue piercing:   Pulmonary neg pulmonary ROS,    Pulmonary exam normal breath sounds clear to auscultation       Cardiovascular negative cardio ROS Normal cardiovascular exam Rhythm:Regular Rate:Normal     Neuro/Psych negative neurological ROS     GI/Hepatic negative GI ROS, Neg liver ROS,   Endo/Other  negative endocrine ROS  Renal/GU negative Renal ROS     Musculoskeletal negative musculoskeletal ROS (+)   Abdominal   Peds  Hematology negative hematology ROS (+)   Anesthesia Other Findings Day of surgery medications reviewed with the patient.  Reproductive/Obstetrics (+) Pregnancy                             Anesthesia Physical  Anesthesia Plan  ASA: II  Anesthesia Plan: Epidural   Post-op Pain Management:    Induction:   PONV Risk Score and Plan: Treatment may vary due to age or medical condition  Airway Management Planned: Natural Airway  Additional Equipment:   Intra-op Plan:   Post-operative Plan:   Informed Consent: I have reviewed the patients History and Physical, chart, labs and discussed the procedure including the risks, benefits and alternatives for the proposed anesthesia with the patient or authorized representative who has indicated his/her understanding and acceptance.       Plan Discussed with: CRNA  Anesthesia Plan Comments:         Anesthesia Quick Evaluation  

## 2018-04-03 NOTE — Anesthesia Pain Management Evaluation Note (Signed)
  CRNA Pain Management Visit Note  Patient: Taylor Poole, 18 y.o., female  "Hello I am a member of the anesthesia team at [facility]. We have an anesthesia team available at all times to provide care throughout the hospital, including epidural management and anesthesia for C-section. I don't know your plan for the delivery whether it a natural birth, water birth, IV sedation, nitrous supplementation, doula or epidural, but we want to meet your pain goals."   1.Was your pain managed to your expectations on prior hospitalizations?   No prior hospitalizations  2.What is your expectation for pain management during this hospitalization?     IV pain meds, possible epidural  3.How can we help you reach that goal?   Record the patient's initial score and the patient's pain goal.   Pain: 2  Pain Goal: 5 [facility] wants you to be able to say your pain was always managed very well.  [name] 04/03/2018

## 2018-04-03 NOTE — MAU Note (Signed)
Pt reports water broke around 0430-clear fluid. Pt reports some mild contractions. Reports good fetal movement. Cervix was 1cm on last SVE.
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
## 2018-04-04 NOTE — Anesthesia Postprocedure Evaluation (Signed)
Anesthesia Post Note  Patient: Taylor Poole  Procedure(s) Performed: AN AD HOC LABOR EPIDURAL     Patient location during evaluation: Mother Baby Anesthesia Type: Epidural Level of consciousness: awake Pain management: satisfactory to patient Vital Signs Assessment: post-procedure vital signs reviewed and stable Respiratory status: spontaneous breathing Cardiovascular status: stable Anesthetic complications: no    Last Vitals:  Vitals:   04/04/18 0233 04/04/18 0520  BP: 120/65 119/75  Pulse: 80 65  Resp: 18 18  Temp: 36.9 C 37 C  SpO2: 99% 100%    Last Pain:  Vitals:   04/04/18 0729  TempSrc:   PainSc: 8    Pain Goal: Patients Stated Pain Goal: 3 (04/04/18 0520)                 Cephus Shelling

## 2018-04-04 NOTE — Progress Notes (Signed)
Post Partum Day 1 Subjective: no complaints, up ad lib, voiding and tolerating PO  Objective: Blood pressure 119/75, pulse 65, temperature 98.6 F (37 C), temperature source Oral, resp. rate 18, height 5\' 2"  (1.575 m), weight 77.6 kg, last menstrual period 07/01/2017, SpO2 100 %, unknown if currently breastfeeding.  Physical Exam:  General: alert, cooperative, appears stated age and no distress Lochia: appropriate Uterine Fundus: firm Incision: NA DVT Evaluation: No evidence of DVT seen on physical exam.  Recent Labs    04/03/18 0510  HGB 10.7*  HCT 32.6*    Assessment/Plan: Plan for discharge tomorrow   LOS: 1 day   Gwenevere Abbot 04/04/2018, 8:57 AM

## 2018-04-04 NOTE — Plan of Care (Signed)
  Problem: Pain Managment: Goal: General experience of comfort will improve Outcome: Progressing Note:  Educated on normal cramping during breastfeeding & given heat packs to use as needed.  Pt has only required scheduled pharmacological pain management this shift.   Problem: Education: Goal: Knowledge of condition will improve Outcome: Progressing   Problem: Activity: Goal: Will verbalize the importance of balancing activity with adequate rest periods Outcome: Progressing   Problem: Coping: Goal: Ability to identify and utilize available resources and services will improve Outcome: Progressing Note:  Pt has been evaluated by CSW this shift (see note).  Pt mood & affect has been appropriate this shift, engaged in newborn care.   Problem: Life Cycle: Goal: Chance of risk for complications during the postpartum period will decrease Outcome: Progressing Note:  [employer], small amt lochia without clots this shift.   Problem: Role Relationship: Goal: Ability to demonstrate positive interaction with newborn will improve Outcome: Progressing

## 2018-04-05 MED ORDER — WITCH HAZEL-GLYCERIN EX PADS: 1.0000 "application " | MEDICATED_PAD | CUTANEOUS | 12 refills | Status: DC | PRN

## 2018-04-05 MED ORDER — PRENATAL MULTIVITAMIN CH: 1.0000 | ORAL_TABLET | Freq: Every day | ORAL | 0 refills | Status: AC

## 2018-04-05 MED ORDER — IBUPROFEN 600 MG PO TABS: 600.0000 mg | ORAL_TABLET | Freq: Four times a day (QID) | ORAL | 0 refills | Status: DC

## 2018-04-05 NOTE — Discharge Instructions (Signed)
Hypertension During Pregnancy    Contact a doctor if:  You have symptoms that your doctor told you to watch for, such as: ? Throwing up (vomiting). ? Feeling sick to your stomach (nausea). ? Headache. Get help right away if you have:  Very bad belly pain that does not get better with treatment.  A very bad headache that does not get better.  Throwing up that does not get better with treatment.  Sudden, fast weight gain.  Sudden swelling in your hands, ankles, or face.  Blurry vision.  Double vision.  Muscle twitching.  Sudden muscle tightening (spasms).  Trouble breathing.  Blue fingernails or lips. Summary  Hypertension is also called high blood pressure. High blood pressure means that the force of your blood moving in your body is too strong.  When you are pregnant, this condition should be watched carefully. It can cause problems for you and your baby.  Get help right away if you have symptoms that your doctor told you to watch for. This information is not intended to replace advice given to you by your health care provider. Make sure you discuss any questions you have with your health care provider. Document Released: 03/30/2010 Document Revised: 02/11/2017 Document Reviewed: 11/07/2015 Elsevier Interactive Patient Education  2019 [employer].

## 2018-04-10 ENCOUNTER — Encounter: Payer: Medicaid Other | Admitting: Obstetrics and Gynecology

## 2018-05-11 ENCOUNTER — Ambulatory Visit: Payer: Self-pay | Admitting: Women's Health

## 2018-05-14 ENCOUNTER — Ambulatory Visit: Payer: Medicaid Other | Admitting: Women's Health

## 2018-05-18 ENCOUNTER — Ambulatory Visit: Payer: Medicaid Other | Admitting: Advanced Practice Midwife

## 2018-08-21 ENCOUNTER — Encounter: Payer: Self-pay | Admitting: *Deleted

## 2018-08-24 ENCOUNTER — Encounter: Payer: Medicaid Other | Admitting: Adult Health

## 2018-08-29 ENCOUNTER — Other Ambulatory Visit: Payer: Self-pay

## 2018-08-29 ENCOUNTER — Encounter (HOSPITAL_COMMUNITY): Payer: Self-pay | Admitting: Emergency Medicine

## 2018-08-29 ENCOUNTER — Emergency Department (HOSPITAL_COMMUNITY): Payer: Medicaid Other

## 2018-08-29 ENCOUNTER — Emergency Department (HOSPITAL_COMMUNITY)
Admission: EM | Admit: 2018-08-29 | Discharge: 2018-08-29 | Disposition: A | Discharge status: Home / Self Care | Payer: Medicaid Other | Attending: Emergency Medicine | Admitting: Emergency Medicine

## 2018-08-29 DIAGNOSIS — Y999 Unspecified external cause status: Secondary | ICD-10-CM | POA: Diagnosis not present

## 2018-08-29 DIAGNOSIS — Y929 Unspecified place or not applicable: Secondary | ICD-10-CM | POA: Diagnosis not present

## 2018-08-29 DIAGNOSIS — Z87891 Personal history of nicotine dependence: Secondary | ICD-10-CM | POA: Diagnosis not present

## 2018-08-29 DIAGNOSIS — S022XXA Fracture of nasal bones, initial encounter for closed fracture: Secondary | ICD-10-CM

## 2018-08-29 DIAGNOSIS — W501XXA Accidental kick by another person, initial encounter: Secondary | ICD-10-CM | POA: Diagnosis not present

## 2018-08-29 DIAGNOSIS — Z3A01 Less than 8 weeks gestation of pregnancy: Secondary | ICD-10-CM | POA: Diagnosis not present

## 2018-08-29 DIAGNOSIS — R112 Nausea with vomiting, unspecified: Secondary | ICD-10-CM

## 2018-08-29 DIAGNOSIS — S0083XA Contusion of other part of head, initial encounter: Secondary | ICD-10-CM | POA: Diagnosis not present

## 2018-08-29 DIAGNOSIS — O219 Vomiting of pregnancy, unspecified: Secondary | ICD-10-CM | POA: Diagnosis present

## 2018-08-29 DIAGNOSIS — Y939 Activity, unspecified: Secondary | ICD-10-CM | POA: Diagnosis not present

## 2018-08-29 LAB — CBC
HCT: 35.8 % — ABNORMAL LOW (ref 36.0–46.0)
Hemoglobin: 11.8 g/dL — ABNORMAL LOW (ref 12.0–15.0)
MCH: 27.3 pg (ref 26.0–34.0)
MCHC: 33 g/dL (ref 30.0–36.0)
MCV: 82.7 fL (ref 80.0–100.0)
Platelets: 148 10*3/uL — ABNORMAL LOW (ref 150–400)
RBC: 4.33 MIL/uL (ref 3.87–5.11)
RDW: 14.5 % (ref 11.5–15.5)
WBC: 7.7 10*3/uL (ref 4.0–10.5)
nRBC: 0 % (ref 0.0–0.2)

## 2018-08-29 LAB — BASIC METABOLIC PANEL
Anion gap: 10 (ref 5–15)
BUN: 10 mg/dL (ref 6–20)
CO2: 19 mmol/L — ABNORMAL LOW (ref 22–32)
Calcium: 9.3 mg/dL (ref 8.9–10.3)
Chloride: 104 mmol/L (ref 98–111)
Creatinine, Ser: 0.72 mg/dL (ref 0.44–1.00)
GFR calc Af Amer: >60 mL/min (ref >60)
GFR calc non Af Amer: >60 mL/min (ref >60)
Glucose, Bld: 108 mg/dL — ABNORMAL HIGH (ref 70–99)
Potassium: 3.1 mmol/L — ABNORMAL LOW (ref 3.5–5.1)
Sodium: 133 mmol/L — ABNORMAL LOW (ref 135–145)

## 2018-08-29 LAB — ABO/RH: ABO/RH(D): A POS

## 2018-08-29 MED ORDER — ONDANSETRON 4 MG PO TBDP: 4.0000 mg | ORAL_TABLET | Freq: Three times a day (TID) | ORAL | 0 refills | Status: DC | PRN

## 2018-08-29 MED ORDER — SODIUM CHLORIDE 0.9 % IV BOLUS
1000.0000 mL | Freq: Once | INTRAVENOUS | Status: AC
  Administered 2018-08-29: 1000 mL via INTRAVENOUS

## 2018-08-29 MED ORDER — ONDANSETRON HCL 4 MG/2ML IJ SOLN
4.0000 mg | Freq: Once | INTRAMUSCULAR | Status: AC
  Administered 2018-08-29: 4 mg via INTRAVENOUS
  Filled 2018-08-29: qty 2
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
## 2018-08-29 NOTE — ED Notes (Signed)
Pt still refusing to give urine sample at this time. Pt also refusing PO fluids. MD notified.
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
## 2018-08-29 NOTE — ED Notes (Signed)
Pt back from CT

## 2018-08-29 NOTE — ED Notes (Signed)
Asked pt to provide urine specimen. States "no I can't right now"

## 2018-08-29 NOTE — ED Notes (Signed)
Pt asked again for urine sample, states she is unable to provide one at this time.
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
## 2018-08-31 ENCOUNTER — Telehealth: Payer: Self-pay | Admitting: Obstetrics & Gynecology

## 2018-08-31 NOTE — Telephone Encounter (Signed)
Patient called, stated she recently went to [facility] and they told her she was pregnant.  Please advise what to schedule.  [phone]

## 2018-09-04 ENCOUNTER — Other Ambulatory Visit: Payer: Self-pay | Admitting: Obstetrics and Gynecology

## 2018-09-04 DIAGNOSIS — O3680X0 Pregnancy with inconclusive fetal viability, not applicable or unspecified: Secondary | ICD-10-CM

## 2018-09-07 ENCOUNTER — Other Ambulatory Visit: Payer: Medicaid Other

## 2018-09-10 ENCOUNTER — Ambulatory Visit (INDEPENDENT_AMBULATORY_CARE_PROVIDER_SITE_OTHER): Payer: Medicaid Other

## 2018-09-10 ENCOUNTER — Other Ambulatory Visit: Payer: Self-pay

## 2018-09-10 DIAGNOSIS — Z3A08 8 weeks gestation of pregnancy: Secondary | ICD-10-CM

## 2018-09-10 DIAGNOSIS — O3680X0 Pregnancy with inconclusive fetal viability, not applicable or unspecified: Secondary | ICD-10-CM

## 2018-09-10 NOTE — Progress Notes (Signed)
[place] 8+5 wks,single IUP w/ys,positive fht 166 bpm,normal ovaries bilat,crl 21.75 mm

## 2018-10-09 ENCOUNTER — Other Ambulatory Visit: Payer: Self-pay | Admitting: Obstetrics and Gynecology

## 2018-10-09 DIAGNOSIS — Z3682 Encounter for antenatal screening for nuchal translucency: Secondary | ICD-10-CM

## 2018-10-12 ENCOUNTER — Other Ambulatory Visit: Payer: Self-pay

## 2018-10-12 ENCOUNTER — Ambulatory Visit: Payer: Medicaid Other | Admitting: *Deleted

## 2018-10-12 ENCOUNTER — Encounter: Payer: Self-pay | Admitting: Women's Health

## 2018-10-12 ENCOUNTER — Ambulatory Visit (INDEPENDENT_AMBULATORY_CARE_PROVIDER_SITE_OTHER): Payer: Medicaid Other | Admitting: Women's Health

## 2018-10-12 ENCOUNTER — Ambulatory Visit (INDEPENDENT_AMBULATORY_CARE_PROVIDER_SITE_OTHER): Payer: Medicaid Other

## 2018-10-12 VITALS — BP 112/48 | HR 72 | Wt 152.0 lb

## 2018-10-12 DIAGNOSIS — Z3481 Encounter for supervision of other normal pregnancy, first trimester: Secondary | ICD-10-CM

## 2018-10-12 DIAGNOSIS — Z363 Encounter for antenatal screening for malformations: Secondary | ICD-10-CM

## 2018-10-12 DIAGNOSIS — Z1379 Encounter for other screening for genetic and chromosomal anomalies: Secondary | ICD-10-CM

## 2018-10-12 DIAGNOSIS — Z1389 Encounter for screening for other disorder: Secondary | ICD-10-CM

## 2018-10-12 DIAGNOSIS — Z3A13 13 weeks gestation of pregnancy: Secondary | ICD-10-CM

## 2018-10-12 DIAGNOSIS — Z331 Pregnant state, incidental: Secondary | ICD-10-CM

## 2018-10-12 DIAGNOSIS — Z3490 Encounter for supervision of normal pregnancy, unspecified, unspecified trimester: Secondary | ICD-10-CM | POA: Insufficient documentation

## 2018-10-12 DIAGNOSIS — Z3682 Encounter for antenatal screening for nuchal translucency: Secondary | ICD-10-CM

## 2018-10-12 DIAGNOSIS — Z1371 Encounter for nonprocreative screening for genetic disease carrier status: Secondary | ICD-10-CM

## 2018-10-12 LAB — POCT URINALYSIS DIPSTICK OB
Blood, UA: NEGATIVE
Glucose, UA: NEGATIVE
Ketones, UA: NEGATIVE
Leukocytes, UA: NEGATIVE
Nitrite, UA: NEGATIVE
POC,PROTEIN,UA: NEGATIVE

## 2018-10-12 NOTE — Patient Instructions (Signed)
Taylor Poole, I greatly value your feedback.  If you receive a survey following your visit with [place] today, we appreciate you taking the time to fill it out.  Thanks, Knute Neu, CNM, [facility]  [place]!!! It is now [place] [facility] ([place], [place] [address]) Entrance located off of [address] parking   Nausea & Vomiting  Have saltine crackers or pretzels by your bed and eat a few bites before you raise your head out of bed in the morning  Eat small frequent meals throughout the day instead of large meals  Drink plenty of fluids throughout the day to stay hydrated, just don't drink a lot of fluids with your meals.  This can make your stomach fill up faster making you feel sick  Do not brush your teeth right after you eat  Products with real ginger are good for nausea, like ginger ale and ginger hard candy Make sure it says made with real ginger!  Sucking on sour candy like lemon heads is also good for nausea  If your prenatal vitamins make you nauseated, take them at night so you will sleep through the nausea  Sea Bands  If you feel like you need medicine for the nausea & vomiting please let us know  If you are unable to keep any fluids or food down please let us know   Constipation  Drink plenty of fluid, preferably water, throughout the day  Eat foods high in fiber such as fruits, vegetables, and grains  Exercise, such as walking, is a good way to keep your bowels regular  Drink warm fluids, especially warm prune juice, or decaf coffee  Eat a 1/2 cup of real oatmeal (not instant), 1/2 cup applesauce, and 1/2-1 cup warm prune juice every day  If needed, you may take Colace (docusate sodium) stool softener once or twice a day to help keep the stool soft.   If you still are having problems with constipation, you may take Miralax once daily as needed to help keep your bowels regular.   Home Blood  Pressure Monitoring for Patients   Your provider has recommended that you check your blood pressure (BP) at least once a week at home. If you do not have a blood pressure cuff at home, one will be provided for you. Contact your provider if you have not received your monitor within 1 week.   Helpful Tips for Accurate Home Blood Pressure Checks  . Don't smoke, exercise, or drink caffeine 30 minutes before checking your BP . Use the restroom before checking your BP (a full bladder can raise your pressure) . Relax in a comfortable upright chair . Feet on the ground . Left arm resting comfortably on a flat surface at the level of your heart . Legs uncrossed . Back supported . Sit quietly and don't talk . Place the cuff on your bare arm . Adjust snuggly, so that only two fingertips can fit between your skin and the top of the cuff . Check 2 readings separated by at least one minute . Keep a log of your BP readings . For a visual, please reference this diagram: http://ccnc.care/bpdiagram  Provider Name: [facility]     Phone: [phone]  Zone 1: ALL CLEAR  Continue to monitor your symptoms:  . BP reading is less than 140 (top number) or less than 90 (bottom number)  . No right upper stomach pain .  No headaches or seeing spots . No feeling nauseated or throwing up . No swelling in face and hands  Zone 2: CAUTION Call your doctor's office for any of the following:  . BP reading is greater than 140 (top number) or greater than 90 (bottom number)  . Stomach pain under your ribs in the middle or right side . Headaches or seeing spots . Feeling nauseated or throwing up . Swelling in face and hands  Zone 3: EMERGENCY  Seek immediate medical care if you have any of the following:  . BP reading is greater than160 (top number) or greater than 110 (bottom number) . Severe headaches not improving with Tylenol . Serious difficulty catching your breath . Any worsening symptoms from Zone  2    First Trimester of Pregnancy The first trimester of pregnancy is from week 1 until the end of week 12 (months 1 through 3). A week after a sperm fertilizes an egg, the egg will implant on the wall of the uterus. This embryo will begin to develop into a baby. Genes from you and your partner are forming the baby. The female genes determine whether the baby is a boy or a girl. At 6-8 weeks, the eyes and face are formed, and the heartbeat can be seen on ultrasound. At the end of 12 weeks, all the baby's organs are formed.  Now that you are pregnant, you will want to do everything you can to have a healthy baby. Two of the most important things are to get good prenatal care and to follow your health care provider's instructions. Prenatal care is all the medical care you receive before the baby's birth. This care will help prevent, find, and treat any problems during the pregnancy and childbirth. BODY CHANGES Your body goes through many changes during pregnancy. The changes vary from woman to woman.   You may gain or lose a couple of pounds at first.  You may feel sick to your stomach (nauseous) and throw up (vomit). If the vomiting is uncontrollable, call your health care provider.  You may tire easily.  You may develop headaches that can be relieved by medicines approved by your health care provider.  You may urinate more often. Painful urination may mean you have a bladder infection.  You may develop heartburn as a result of your pregnancy.  You may develop constipation because certain hormones are causing the muscles that push waste through your intestines to slow down.  You may develop hemorrhoids or swollen, bulging veins (varicose veins).  Your breasts may begin to grow larger and become tender. Your nipples may stick out more, and the tissue that surrounds them (areola) may become darker.  Your gums may bleed and may be sensitive to brushing and flossing.  Dark spots or blotches  (chloasma, mask of pregnancy) may develop on your face. This will likely fade after the baby is born.  Your menstrual periods will stop.  You may have a loss of appetite.  You may develop cravings for certain kinds of food.  You may have changes in your emotions from day to day, such as being excited to be pregnant or being concerned that something may go wrong with the pregnancy and baby.  You may have more vivid and strange dreams.  You may have changes in your hair. These can include thickening of your hair, rapid growth, and changes in texture. Some women also have hair loss during or after pregnancy, or hair that feels dry  or thin. Your hair will most likely return to normal after your baby is born. WHAT TO EXPECT AT YOUR PRENATAL VISITS During a routine prenatal visit:  You will be weighed to make sure you and the baby are growing normally.  Your blood pressure will be taken.  Your abdomen will be measured to track your baby's growth.  The fetal heartbeat will be listened to starting around week 10 or 12 of your pregnancy.  Test results from any previous visits will be discussed. Your health care provider may ask you:  How you are feeling.  If you are feeling the baby move.  If you have had any abnormal symptoms, such as leaking fluid, bleeding, severe headaches, or abdominal cramping.  If you have any questions. Other tests that may be performed during your first trimester include:  Blood tests to find your blood type and to check for the presence of any previous infections. They will also be used to check for low iron levels (anemia) and Rh antibodies. Later in the pregnancy, blood tests for diabetes will be done along with other tests if problems develop.  Urine tests to check for infections, diabetes, or protein in the urine.  An ultrasound to confirm the proper growth and development of the baby.  An amniocentesis to check for possible genetic problems.  Fetal  screens for spina bifida and Down syndrome.  You may need other tests to make sure you and the baby are doing well. HOME CARE INSTRUCTIONS  Medicines  Follow your health care provider's instructions regarding medicine use. Specific medicines may be either safe or unsafe to take during pregnancy.  Take your prenatal vitamins as directed.  If you develop constipation, try taking a stool softener if your health care provider approves. Diet  Eat regular, well-balanced meals. Choose a variety of foods, such as meat or vegetable-based protein, fish, milk and low-fat dairy products, vegetables, fruits, and whole grain breads and cereals. Your health care provider will help you determine the amount of weight gain that is right for you.  Avoid raw meat and uncooked cheese. These carry germs that can cause birth defects in the baby.  Eating four or five small meals rather than three large meals a day may help relieve nausea and vomiting. If you start to feel nauseous, eating a few soda crackers can be helpful. Drinking liquids between meals instead of during meals also seems to help nausea and vomiting.  If you develop constipation, eat more high-fiber foods, such as fresh vegetables or fruit and whole grains. Drink enough fluids to keep your urine clear or pale yellow. Activity and Exercise  Exercise only as directed by your health care provider. Exercising will help you:  Control your weight.  Stay in shape.  Be prepared for labor and delivery.  Experiencing pain or cramping in the lower abdomen or low back is a good sign that you should stop exercising. Check with your health care provider before continuing normal exercises.  Try to avoid standing for long periods of time. Move your legs often if you must stand in one place for a long time.  Avoid heavy lifting.  Wear low-heeled shoes, and practice good posture.  You may continue to have sex unless your health care provider directs you  otherwise. Relief of Pain or Discomfort  Wear a good support bra for breast tenderness.    Take warm sitz baths to soothe any pain or discomfort caused by hemorrhoids. Use hemorrhoid cream if your  health care provider approves.    Rest with your legs elevated if you have leg cramps or low back pain.  If you develop varicose veins in your legs, wear support hose. Elevate your feet for 15 minutes, 3-4 times a day. Limit salt in your diet. Prenatal Care  Schedule your prenatal visits by the twelfth week of pregnancy. They are usually scheduled monthly at first, then more often in the last 2 months before delivery.  Write down your questions. Take them to your prenatal visits.  Keep all your prenatal visits as directed by your health care provider. Safety  Wear your seat belt at all times when driving.  Make a list of emergency phone numbers, including numbers for family, friends, the hospital, and police and fire departments. General Tips  Ask your health care provider for a referral to a local prenatal education class. Begin classes no later than at the beginning of month 6 of your pregnancy.  Ask for help if you have counseling or nutritional needs during pregnancy. Your health care provider can offer advice or refer you to specialists for help with various needs.  Do not use hot tubs, steam rooms, or saunas.  Do not douche or use tampons or scented sanitary pads.  Do not cross your legs for long periods of time.  Avoid cat litter boxes and soil used by cats. These carry germs that can cause birth defects in the baby and possibly loss of the fetus by miscarriage or stillbirth.  Avoid all smoking, herbs, alcohol, and medicines not prescribed by your health care provider. Chemicals in these affect the formation and growth of the baby.  Schedule a dentist appointment. At home, brush your teeth with a soft toothbrush and be gentle when you floss. SEEK MEDICAL CARE IF:   You have  dizziness.  You have mild pelvic cramps, pelvic pressure, or nagging pain in the abdominal area.  You have persistent nausea, vomiting, or diarrhea.  You have a bad smelling vaginal discharge.  You have pain with urination.  You notice increased swelling in your face, hands, legs, or ankles. SEEK IMMEDIATE MEDICAL CARE IF:   You have a fever.  You are leaking fluid from your vagina.  You have spotting or bleeding from your vagina.  You have severe abdominal cramping or pain.  You have rapid weight gain or loss.  You vomit blood or material that looks like coffee grounds.  You are exposed to [place] measles and have never had them.  You are exposed to fifth disease or chickenpox.  You develop a severe headache.  You have shortness of breath.  You have any kind of trauma, such as from a fall or a car accident. Document Released: 02/19/2001 Document Revised: 07/12/2013 Document Reviewed: 01/05/2013 [facility] Patient Information 2015 [place], [place]. This information is not intended to replace advice given to you by your health care provider. Make sure you discuss any questions you have with your health care provider.  Coronavirus (COVID-19) Are you at risk?  Are you at risk for the Coronavirus (COVID-19)?  To be considered HIGH RISK for Coronavirus (COVID-19), you have to meet the following criteria:  . Traveled to [place], [place], [place], [place] or [place]; or in the [place] to [place], [place], [place], or [place]; and have fever, cough, and shortness of breath within the last 2 weeks of travel OR . Been in close contact with a person diagnosed with COVID-19 within the last 2 weeks and  have fever, cough, and shortness of breath . IF YOU DO NOT MEET THESE CRITERIA, YOU ARE CONSIDERED LOW RISK FOR COVID-19.  What to do if you are HIGH RISK for COVID-19?  Marland Kitchen If you are having a medical emergency, call 911. . Seek medical care right away. Before you go to a  doctor's office, urgent care or emergency department, call ahead and tell them about your recent travel, contact with someone diagnosed with COVID-19, and your symptoms. You should receive instructions from your physician's office regarding next steps of care.  . When you arrive at healthcare provider, tell the healthcare staff immediately you have returned from visiting [place], [place], [place], [place] or [place]; or traveled in the [place] to [place], [place], [place], or [place]; in the last two weeks or you have been in close contact with a person diagnosed with COVID-19 in the last 2 weeks.   . Tell the health care staff about your symptoms: fever, cough and shortness of breath. . After you have been seen by a medical provider, you will be either: o Tested for (COVID-19) and discharged home on quarantine except to seek medical care if symptoms worsen, and asked to  - Stay home and avoid contact with others until you get your results (4-5 days)  - Avoid travel on public transportation if possible (such as bus, train, or airplane) or o Sent to the Emergency Department by EMS for evaluation, COVID-19 testing, and possible admission depending on your condition and test results.  What to do if you are LOW RISK for COVID-19?  Reduce your risk of any infection by using the same precautions used for avoiding the common cold or flu:  Marland Kitchen Wash your hands often with soap and warm water for at least 20 seconds.  If soap and water are not readily available, use an alcohol-based hand sanitizer with at least 60% alcohol.  . If coughing or sneezing, cover your mouth and nose by coughing or sneezing into the elbow areas of your shirt or coat, into a tissue or into your sleeve (not your hands). . Avoid shaking hands with others and consider head nods or verbal greetings only. . Avoid touching your eyes, nose, or mouth with unwashed hands.  . Avoid close contact with people who are sick. . Avoid  places or events with large numbers of people in one location, like concerts or sporting events. . Carefully consider travel plans you have or are making. . If you are planning any travel outside or inside the [place], visit the CDC's Travelers' Health webpage for the latest health notices. . If you have some symptoms but not all symptoms, continue to monitor at home and seek medical attention if your symptoms worsen. . If you are having a medical emergency, call 911.   [place] / e-Visit: [email or web address]         [facility]: [facility]: [id number]                   [facility]: [phone]

## 2018-10-12 NOTE — Progress Notes (Signed)
[place] 13+2 wks,measurements c/w dates,crl 69.15 mm,normal ovaries bilat,posterior placenta gr 0,NB presents,NT 2.4 mm,fhr 160 bpm

## 2018-10-12 NOTE — Progress Notes (Signed)
INITIAL OBSTETRICAL VISIT Patient name: Taylor Poole MRN [id number]  Date of birth: 06/28/2000 Chief Complaint:   Initial Prenatal Visit  History of Present Illness:   Taylor Poole is a 18 y.o. 382P1001 African American female at 6723w2d by 8wk u/s, with an Estimated Date of Delivery: 04/17/19 being seen today for her initial obstetrical visit.   Her obstetrical history is significant for term uncomplicated SVB.   Today she reports no complaints.  No LMP recorded. Patient is pregnant. Last pap <21yo. Results were: n/a Review of Systems:   Pertinent items are noted in HPI Denies cramping/contractions, leakage of fluid, vaginal bleeding, abnormal vaginal discharge w/ itching/odor/irritation, headaches, visual changes, shortness of breath, chest pain, abdominal pain, severe nausea/vomiting, or problems with urination or bowel movements unless otherwise stated above.  Pertinent History Reviewed:  Reviewed past medical,surgical, social, obstetrical and family history.  Reviewed problem list, medications and allergies. OB History  Gravida Para Term Preterm AB Living  2 1 1     1   SAB TAB Ectopic Multiple Live Births          1    # Outcome Date GA Lbr Len/2nd Weight Sex Delivery Anes PTL Lv  2 Current           1 Term 04/03/18 5163w3d 02:44 / 02:09 6 lb 7.5 oz (2.935 kg) F Vag-Spont EPI  LIV   Physical Assessment:   Vitals:   10/12/18 1005  BP: (!) 112/48  Pulse: 72  Weight: 152 lb (68.9 kg)  Body mass index is 27.8 kg/m.       Physical Examination:  General appearance - well appearing, and in no distress  Mental status - alert, oriented to person, place, and time  Psych:  She has a normal mood and affect  Skin - warm and dry, normal color, no suspicious lesions noted  Chest - effort normal, all lung fields clear to auscultation bilaterally  Heart - normal rate and regular rhythm  Abdomen - soft, nontender  Extremities:  No swelling or varicosities noted  Thin prep pap is not  done   TODAY'S NT US 13+2 wks,measurements c/w dates,crl 69.15 mm,normal ovaries bilat,posterior placenta gr 0,NB presents,NT 2.4 mm,fhr 160 bpm  Results for orders placed or performed in visit on 10/12/18 (from the past 24 hour(s))  POC Urinalysis Dipstick OB   Collection Time: 10/12/18 10:16 AM  Result Value Ref Range   Color, UA     Clarity, UA     Glucose, UA Negative Negative   Bilirubin, UA     Ketones, UA neg    Spec Grav, UA     Blood, UA neg    pH, UA     POC,PROTEIN,UA Negative Negative, Trace, Small (1+), Moderate (2+), Large (3+), 4+   Urobilinogen, UA     Nitrite, UA neg    Leukocytes, UA Negative Negative   Appearance     Odor      Assessment & Plan:  1) Low-Risk Pregnancy G2P1001 at 8223w2d with an Estimated Date of Delivery: 04/17/19   2) Initial OB visit   Meds: No orders of the defined types were placed in this encounter.   Initial labs obtained Continue prenatal vitamins Reviewed n/v relief measures and warning s/s to report Reviewed recommended weight gain based on pre-gravid BMI Encouraged well-balanced diet Genetic Screening discussed: requested nt/it, maternit21 Cystic fibrosis, SMA, Fragile X screening discussed requested Ultrasound discussed; fetal survey: requested CCNC completed>PCM not here, form faxed Has home bp  cuff. Check bp weekly, let us know if >140/90.   Follow-up: Return in about 5 weeks (around 11/16/2018) for LROB, IE:PPIRJJO, 2nd IT, in person.   Orders Placed This Encounter  Procedures  . GC/Chlamydia Probe Amp  . Urine Culture  . US OB Comp + 14 Wk  . Integrated 1  . Obstetric Panel, Including HIV  . Urinalysis, Routine w reflex microscopic  . Fragile X, PCR and Southern  . SMN1 COPY NUMBER ANALYSIS (SMA Carrier Screen)  . MaterniT 21 plus Core, Blood  . Pain Management Screening Profile (10S)  . POC Urinalysis Dipstick OB    Roma Schanz CNM, [facility] 10/12/2018 10:54 AM

## 2018-10-13 ENCOUNTER — Encounter: Payer: Self-pay | Admitting: Women's Health

## 2018-10-13 DIAGNOSIS — F1290 Cannabis use, unspecified, uncomplicated: Secondary | ICD-10-CM | POA: Insufficient documentation

## 2018-10-13 LAB — PMP SCREEN PROFILE (10S), URINE
Amphetamine Scrn, Ur: NEGATIVE ng/mL
BARBITURATE SCREEN URINE: NEGATIVE ng/mL
BENZODIAZEPINE SCREEN, URINE: NEGATIVE ng/mL
CANNABINOIDS UR QL SCN: POSITIVE ng/mL — AB
Cocaine (Metab) Scrn, Ur: NEGATIVE ng/mL
Creatinine(Crt), U: 258.7 mg/dL (ref 20.0–300.0)
Methadone Screen, Urine: NEGATIVE ng/mL
OXYCODONE+OXYMORPHONE UR QL SCN: NEGATIVE ng/mL
Opiate Scrn, Ur: NEGATIVE ng/mL
Ph of Urine: 5.6 (ref 4.5–8.9)
Phencyclidine Qn, Ur: NEGATIVE ng/mL
Propoxyphene Scrn, Ur: NEGATIVE ng/mL

## 2018-10-13 LAB — MED LIST OPTION NOT SELECTED

## 2018-10-14 LAB — URINE CULTURE

## 2018-10-17 LAB — GC/CHLAMYDIA PROBE AMP
Chlamydia trachomatis, NAA: NEGATIVE
Neisseria Gonorrhoeae by PCR: NEGATIVE

## 2018-10-26 LAB — INTEGRATED 1
Crown Rump Length: 69.2 mm
Gest. Age on Collection Date: 13 weeks
Maternal Age at EDD: 20.3 yr
Nuchal Translucency (NT): 2.4 mm
Number of Fetuses: 1
PAPP-A Value: 2426 ng/mL
Weight: 152 [lb_av]

## 2018-10-26 LAB — SMN1 COPY NUMBER ANALYSIS (SMA CARRIER SCREENING)

## 2018-10-26 LAB — MATERNIT 21 PLUS CORE, BLOOD
Fetal Fraction: 10
Result (T21): NEGATIVE
Trisomy 13 (Patau syndrome): NEGATIVE
Trisomy 18 (Edwards syndrome): NEGATIVE
Trisomy 21 (Down syndrome): NEGATIVE

## 2018-10-26 LAB — OBSTETRIC PANEL, INCLUDING HIV
Antibody Screen: NEGATIVE
Basophils Absolute: 0 10*3/uL (ref 0.0–0.2)
Basos: 0 %
EOS (ABSOLUTE): 0.1 10*3/uL (ref 0.0–0.4)
Eos: 1 %
HIV Screen 4th Generation wRfx: NONREACTIVE
Hematocrit: 36.1 % (ref 34.0–46.6)
Hemoglobin: 11.9 g/dL (ref 11.1–15.9)
Hepatitis B Surface Ag: NEGATIVE
Immature Grans (Abs): 0 10*3/uL (ref 0.0–0.1)
Immature Granulocytes: 1 %
Lymphocytes Absolute: 1.3 10*3/uL (ref 0.7–3.1)
Lymphs: 20 %
MCH: 27.8 pg (ref 26.6–33.0)
MCHC: 33 g/dL (ref 31.5–35.7)
MCV: 84 fL (ref 79–97)
Monocytes Absolute: 0.4 10*3/uL (ref 0.1–0.9)
Monocytes: 6 %
Neutrophils Absolute: 4.6 10*3/uL (ref 1.4–7.0)
Neutrophils: 72 %
Platelets: 179 10*3/uL (ref 150–450)
RBC: 4.28 x10E6/uL (ref 3.77–5.28)
RDW: 15.4 % (ref 11.7–15.4)
RPR Ser Ql: NONREACTIVE
Rh Factor: POSITIVE
Rubella Antibodies, IGG: 9.52 index (ref >0.99)
WBC: 6.4 10*3/uL (ref 3.4–10.8)

## 2018-10-26 LAB — FRAGILE X, PCR AND SOUTHERN

## 2018-10-26 LAB — URINALYSIS, ROUTINE W REFLEX MICROSCOPIC
Bilirubin, UA: NEGATIVE
Glucose, UA: NEGATIVE
Ketones, UA: NEGATIVE
Nitrite, UA: NEGATIVE
Protein,UA: NEGATIVE
RBC, UA: NEGATIVE
Specific Gravity, UA: >=1.03 — AB (ref 1.005–1.030)
Urobilinogen, Ur: 0.2 mg/dL (ref 0.2–1.0)
pH, UA: 5.5 (ref 5.0–7.5)

## 2018-10-26 LAB — MICROSCOPIC EXAMINATION
Casts: NONE SEEN /lpf
Epithelial Cells (non renal): >10 /hpf — AB (ref 0–10)

## 2018-11-17 ENCOUNTER — Encounter: Payer: Medicaid Other | Admitting: Obstetrics & Gynecology

## 2018-11-17 ENCOUNTER — Other Ambulatory Visit: Payer: Medicaid Other

## 2018-11-18 ENCOUNTER — Ambulatory Visit (INDEPENDENT_AMBULATORY_CARE_PROVIDER_SITE_OTHER): Payer: Medicaid Other

## 2018-11-18 ENCOUNTER — Other Ambulatory Visit: Payer: Self-pay

## 2018-11-18 ENCOUNTER — Ambulatory Visit: Payer: Medicaid Other | Admitting: Obstetrics and Gynecology

## 2018-11-18 DIAGNOSIS — Z3481 Encounter for supervision of other normal pregnancy, first trimester: Secondary | ICD-10-CM

## 2018-11-18 DIAGNOSIS — Z3A18 18 weeks gestation of pregnancy: Secondary | ICD-10-CM | POA: Diagnosis not present

## 2018-11-18 DIAGNOSIS — Z363 Encounter for antenatal screening for malformations: Secondary | ICD-10-CM | POA: Diagnosis not present

## 2018-11-18 NOTE — Progress Notes (Signed)
Pt not seen. Stated she couldn't wait to be seen.

## 2018-11-18 NOTE — Progress Notes (Signed)
[place] 79+4 wks,cephalic,posterior placenta gr 0,cx 3.8 cm,svp of fluid 3.9 cm,normal ovaries bilat,fhr 152 bpm,EFW 272 g,anatomy complete,no obvious abnormalities

## 2018-11-21 LAB — INTEGRATED 2
AFP MoM: 1.45
Alpha-Fetoprotein: 66.2 ng/mL
Crown Rump Length: 69.2 mm
DIA MoM: 1.29
DIA Value: 215 pg/mL
Estriol, Unconjugated: 2.33 ng/mL
Gest. Age on Collection Date: 13 weeks
Gestational Age: 18.3 weeks
Maternal Age at EDD: 20.3 yr
Nuchal Translucency (NT): 2.4 mm
Nuchal Translucency MoM: 1.53
Number of Fetuses: 1
PAPP-A MoM: 1.96
PAPP-A Value: 2426 ng/mL
Test Results:: NEGATIVE
Weight: 152 [lb_av]
Weight: 152 [lb_av]
hCG MoM: 0.63
hCG Value: 15.8 IU/mL
uE3 MoM: 1.61

## 2018-12-16 ENCOUNTER — Encounter: Payer: Medicaid Other | Admitting: Obstetrics and Gynecology

## 2018-12-19 ENCOUNTER — Other Ambulatory Visit: Payer: Self-pay

## 2018-12-19 ENCOUNTER — Encounter (HOSPITAL_COMMUNITY): Payer: Self-pay

## 2018-12-19 ENCOUNTER — Emergency Department (HOSPITAL_COMMUNITY)
Admission: EM | Admit: 2018-12-19 | Discharge: 2018-12-19 | Disposition: A | Discharge status: Home / Self Care | Payer: Medicaid Other | Attending: Emergency Medicine | Admitting: Emergency Medicine

## 2018-12-19 DIAGNOSIS — O219 Vomiting of pregnancy, unspecified: Secondary | ICD-10-CM | POA: Insufficient documentation

## 2018-12-19 DIAGNOSIS — O26892 Other specified pregnancy related conditions, second trimester: Secondary | ICD-10-CM | POA: Insufficient documentation

## 2018-12-19 DIAGNOSIS — Z3A23 23 weeks gestation of pregnancy: Secondary | ICD-10-CM | POA: Insufficient documentation

## 2018-12-19 DIAGNOSIS — R1030 Lower abdominal pain, unspecified: Secondary | ICD-10-CM

## 2018-12-19 DIAGNOSIS — Z5320 Procedure and treatment not carried out because of patient's decision for unspecified reasons: Secondary | ICD-10-CM | POA: Insufficient documentation

## 2018-12-19 LAB — URINALYSIS, ROUTINE W REFLEX MICROSCOPIC
Bilirubin Urine: NEGATIVE
Glucose, UA: NEGATIVE mg/dL
Hgb urine dipstick: NEGATIVE
Ketones, ur: 80 mg/dL — AB
Leukocytes,Ua: NEGATIVE
Nitrite: NEGATIVE
Protein, ur: 100 mg/dL — AB
Specific Gravity, Urine: 1.033 — ABNORMAL HIGH (ref 1.005–1.030)
pH: 6 (ref 5.0–8.0)

## 2018-12-19 LAB — WET PREP, GENITAL
Sperm: NONE SEEN
Trich, Wet Prep: NONE SEEN
Yeast Wet Prep HPF POC: NONE SEEN

## 2018-12-19 NOTE — ED Notes (Signed)
Patient ambulatory to restroom with steady gait, clean catch instructions given and advised pt to bring specimen back to room as well.  

## 2018-12-19 NOTE — ED Triage Notes (Signed)
Pt reports constant abd pain x 5 days.  Pt says is also  concerned because her belly is lower than it was her last pregnancy.  Denies any vaginal bleeding or discharge.

## 2018-12-19 NOTE — ED Provider Notes (Signed)
[facility] EMERGENCY DEPARTMENT Provider Note   CSN: [id number] Arrival date & time: 12/19/18  [id number]     History   Chief Complaint Chief Complaint  Patient presents with  . Abdominal Pain    HPI Taylor Deitersiajah Doten is a 18 y.o. female.     HPI   Taylor Poole is a 18 y.o. female G2, P1 at 7723 weeks gestation who presents to the Emergency Department complaining of intermittent lower abdominal pain for 5 days.  She describes the pain as cramp like and lasting a few minutes.  She was scheduled [facility], but missed her appointment.  She reports having nausea and intermittent vomiting throughout her pregnancy.  She is also concerned that her pregnancy seems to be lower in her abdomen than her first pregnancy.  She denies fever, chills, persistent vomiting, dysuria, vaginal bleeding or fluid leakage.  She states that she feels her current symptoms are normal, but came to the emergency room due to concern of her boyfriend.  She has not been taking her prenatal vitamins stating that they make her nausea worse.   Past Medical History:  Diagnosis Date  . Dehydration 10/09/2016  . Depression   . Medical history non-contributory     Patient Active Problem List   Diagnosis Date Noted  . 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 Supervision of normal pregnancy 10/12/2018  . H/O Gestational thrombocytopenia (HCC) 02/12/2018  . Abdominal trauma 02/02/2018  . 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 Cannabinoid hyperemesis syndrome 07/03/2017  . Hypokalemia 07/03/2017    Past Surgical History:  Procedure Laterality Date  . NO PAST SURGERIES    . WISDOM TOOTH EXTRACTION  2015     OB History    Gravida  2   Para  1   Term  1   Preterm      AB      Living  1     SAB      TAB      Ectopic      Multiple      Live Births  1            Home Medications    Prior to Admission medications   Medication Sig Start Date End Date Taking? Authorizing Provider   ondansetron (ZOFRAN ODT) 4 MG disintegrating tablet Take 1 tablet (4 mg total) by mouth every 8 (eight) hours as needed for nausea. Patient not taking: Reported on 10/12/2018 08/29/18   Eber HongMiller, Brian, MD    Family History Family History  Problem Relation Age of Onset  . Cancer Maternal Grandfather     Social History Social History   Tobacco Use  . Smoking status: Never Smoker  . Smokeless tobacco: Never Used  Substance Use Topics  . Alcohol use: No  . Drug use: Not Currently    Types: Marijuana     Allergies   Patient has no known allergies.   Review of Systems Review of Systems  Constitutional: Negative for chills, fatigue and fever.  HENT: Negative for sore throat.   Respiratory: Negative for cough, shortness of breath and wheezing.   Cardiovascular: Negative for chest pain and palpitations.  Gastrointestinal: Positive for abdominal pain. Negative for nausea and vomiting.  Genitourinary: Negative for dysuria, flank pain, vaginal bleeding and vaginal discharge.  Musculoskeletal: Negative for arthralgias, back pain and myalgias.  Skin: Negative for rash.  Neurological: Negative for dizziness, weakness and numbness.  Hematological: Does not bruise/bleed easily.  Physical Exam Updated Vital Signs BP 129/73 (BP Location: Right Arm)   Pulse 80   Temp 98.1 F (36.7 C) (Oral)   Resp 16   Ht 5\' 2"  (1.575 m)   Wt 65.8 kg   SpO2 100%   BMI 26.52 kg/m   Physical Exam Vitals signs and nursing note reviewed.  Constitutional:      Appearance: She is well-developed. She is not ill-appearing or toxic-appearing.  HENT:     Mouth/Throat:     Mouth: Mucous membranes are moist.  Cardiovascular:     Rate and Rhythm: Normal rate and regular rhythm.     Pulses: Normal pulses.  Pulmonary:     Effort: Pulmonary effort is normal.     Breath sounds: Normal breath sounds.  Abdominal:     Tenderness: There is abdominal tenderness.     Comments: Gravid abdomen.  Diffuse ttp  of the entire lower abdomen.  No guarding.    Genitourinary:    Labia:        Right: No rash.        Left: No tenderness.      Vagina: No vaginal discharge or bleeding.     Cervix: No cervical motion tenderness, discharge, friability, erythema or cervical bleeding.     Uterus: Enlarged.      Adnexa:        Right: No mass or tenderness.         Left: No mass or tenderness.       Comments: Cervical os is closed.  No vaginal bleeding. Musculoskeletal:     Right lower leg: No edema.     Left lower leg: No edema.  Skin:    General: Skin is warm.     Findings: No erythema or rash.  Neurological:     General: No focal deficit present.     Mental Status: She is alert.     Sensory: No sensory deficit.      ED Treatments / Results  Labs (all labs ordered are listed, but only abnormal results are displayed) Labs Reviewed  WET PREP, GENITAL - Abnormal; Notable for the following components:      Result Value   Clue Cells Wet Prep HPF POC PRESENT (*)    WBC, Wet Prep HPF POC FEW (*)    All other components within normal limits  URINALYSIS, ROUTINE W REFLEX MICROSCOPIC - Abnormal; Notable for the following components:   Color, Urine AMBER (*)    APPearance HAZY (*)    Specific Gravity, Urine 1.033 (*)    Ketones, ur 80 (*)    Protein, ur 100 (*)    Bacteria, UA RARE (*)    All other components within normal limits  GC/CHLAMYDIA PROBE AMP ([place]) NOT AT [facility]    EKG None  Radiology No results found.  Procedures Procedures (including critical care time)  Medications Ordered in ED Medications - No data to display   Initial Impression / Assessment and Plan / ED Course  I have reviewed the triage vital signs and the nursing notes.  Pertinent labs & imaging results that were available during my care of the patient were reviewed by me and considered in my medical decision making (see chart for details).      FHT's 158  Unable to place patient on continuous fetal  monitoring due to occupantancy of the room with a COVID patient. Patient requesting minimal work up.  Patient had to be persuaded to allow pelvic exam and  give urine sample.    1110  Informed by nursing staff that patient left AMA prior to completion of work up.    Final Clinical Impressions(s) / ED Diagnoses   Final diagnoses:  None    ED Discharge Orders    None       Kem Parkinson, PA-C 12/19/18 2028    Fredia Sorrow, MD 12/21/18 [phone]

## 2018-12-21 LAB — GC/CHLAMYDIA PROBE AMP (~~LOC~~) NOT AT ARMC
Chlamydia: NEGATIVE
Neisseria Gonorrhea: NEGATIVE

## 2018-12-29 ENCOUNTER — Other Ambulatory Visit: Payer: Self-pay

## 2018-12-29 ENCOUNTER — Ambulatory Visit (INDEPENDENT_AMBULATORY_CARE_PROVIDER_SITE_OTHER): Payer: Medicaid Other | Admitting: Obstetrics and Gynecology

## 2018-12-29 DIAGNOSIS — Z3A24 24 weeks gestation of pregnancy: Secondary | ICD-10-CM

## 2018-12-29 DIAGNOSIS — Z331 Pregnant state, incidental: Secondary | ICD-10-CM

## 2018-12-29 DIAGNOSIS — Z3482 Encounter for supervision of other normal pregnancy, second trimester: Secondary | ICD-10-CM

## 2018-12-29 DIAGNOSIS — Z1389 Encounter for screening for other disorder: Secondary | ICD-10-CM

## 2018-12-29 LAB — POCT URINALYSIS DIPSTICK OB
Blood, UA: NEGATIVE
Glucose, UA: NEGATIVE
Leukocytes, UA: NEGATIVE
Nitrite, UA: NEGATIVE

## 2018-12-29 NOTE — Progress Notes (Signed)
Patient ID: Taylor Poole, female   DOB: [birth year], 18 y.o.   MRN: [id number]    LOW-RISK PREGNANCY VISIT Patient name: Taylor Poole MRN [id number]  Date of birth: 25-May-2000 Chief Complaint:   Routine Prenatal Visit  History of Present Illness:   Taylor Poole is a 18 y.o. G77P1001 female at [redacted]w[redacted]d with an Estimated Date of Delivery: 04/17/19 being seen today for ongoing management of a low-risk pregnancy.  No burning in her urine even though there is trace protein in urine. Would like to try [facility] patch postpartum Today she reports no complaints. Contractions: Not present. Vag. Bleeding: None.  Movement: Present. denies leaking of fluid. Review of Systems:   Pertinent items are noted in HPI Denies abnormal vaginal discharge w/ itching/odor/irritation, headaches, visual changes, shortness of breath, chest pain, abdominal pain, severe nausea/vomiting, or problems with urination or bowel movements unless otherwise stated above. Pertinent History Reviewed:  Reviewed past medical,surgical, social, obstetrical and family history.  Reviewed problem list, medications and allergies. Physical Assessment:  There were no vitals filed for this visit.There is no height or weight on file to calculate BMI.        Physical Examination:   General appearance: Well appearing, and in no distress  Mental status: Alert, oriented to person, place, and time  Skin: Warm & dry  Cardiovascular: Normal heart rate noted  Respiratory: Normal respiratory effort, no distress  Abdomen: Soft, gravid, nontender  Pelvic: Cervical exam deferred         Extremities:    Fetal Status: Fetal Heart Rate (bpm): 166 Fundal Height: 22 cm Movement: Present    Results for orders placed or performed in visit on 12/29/18 (from the past 24 hour(s))  POC Urinalysis Dipstick OB   Collection Time: 12/29/18 12:08 PM  Result Value Ref Range   Color, UA     Clarity, UA     Glucose, UA Negative Negative   Bilirubin, UA     Ketones, UA  trace    Spec Grav, UA     Blood, UA n    pH, UA     POC,PROTEIN,UA Trace Negative, Trace, Small (1+), Moderate (2+), Large (3+), 4+   Urobilinogen, UA     Nitrite, UA n    Leukocytes, UA Negative Negative   Appearance     Odor      Assessment & Plan:  1) Low-risk pregnancy G2P1001 at [redacted]w[redacted]d with an Estimated Date of Delivery: 04/17/19    Meds: No orders of the defined types were placed in this encounter.  Labs/procedures today: None  Plan:  Continue routine obstetrical care  Next visit: will be in person for PN2 . Prefers in person visits  Follow-up: Return in about 4 weeks (around 01/26/2019) for PN2 (sugar test).  Orders Placed This Encounter  Procedures  . POC Urinalysis Dipstick OB   By signing my name below, I, Samul Dada, attest that this documentation has been prepared under the direction and in the presence of Jonnie Kind, MD. Electronically Signed: Wallace. 12/29/18. 12:21 PM.  I personally performed the services described in this documentation, which was SCRIBED in my presence. The recorded information has been reviewed and considered accurate. It has been edited as necessary during review. Jonnie Kind, MD

## 2019-01-27 ENCOUNTER — Encounter: Payer: Medicaid Other | Admitting: Advanced Practice Midwife

## 2019-01-27 ENCOUNTER — Other Ambulatory Visit: Payer: Medicaid Other

## 2019-01-29 ENCOUNTER — Encounter: Payer: Self-pay | Admitting: Obstetrics & Gynecology

## 2019-01-29 ENCOUNTER — Other Ambulatory Visit: Payer: Self-pay

## 2019-01-29 ENCOUNTER — Other Ambulatory Visit: Payer: Medicaid Other

## 2019-01-29 ENCOUNTER — Ambulatory Visit (INDEPENDENT_AMBULATORY_CARE_PROVIDER_SITE_OTHER): Payer: Medicaid Other | Admitting: Obstetrics & Gynecology

## 2019-01-29 VITALS — BP 113/66 | HR 80 | Wt 156.0 lb

## 2019-01-29 DIAGNOSIS — Z331 Pregnant state, incidental: Secondary | ICD-10-CM

## 2019-01-29 DIAGNOSIS — Z3483 Encounter for supervision of other normal pregnancy, third trimester: Secondary | ICD-10-CM

## 2019-01-29 DIAGNOSIS — Z3A28 28 weeks gestation of pregnancy: Secondary | ICD-10-CM

## 2019-01-29 DIAGNOSIS — Z1389 Encounter for screening for other disorder: Secondary | ICD-10-CM

## 2019-01-29 NOTE — Progress Notes (Addendum)
   LOW-RISK PREGNANCY VISIT Patient name: Taylor Poole MRN [id number]  Date of birth: 2001/02/02 Chief Complaint:   Routine Prenatal Visit (PN2)  History of Present Illness:   Taylor Poole is a 18 y.o. G14P1001 female at [redacted]w[redacted]d with an Estimated Date of Delivery: 04/17/19 being seen today for ongoing management of a low-risk pregnancy.  Today she reports no complaints. Contractions: Not present. Vag. Bleeding: None.  Movement: Present. denies leaking of fluid. Review of Systems:   Pertinent items are noted in HPI Denies abnormal vaginal discharge w/ itching/odor/irritation, headaches, visual changes, shortness of breath, chest pain, abdominal pain, severe nausea/vomiting, or problems with urination or bowel movements unless otherwise stated above. Pertinent History Reviewed:  Reviewed past medical,surgical, social, obstetrical and family history.  Reviewed problem list, medications and allergies. Physical Assessment:   Vitals:   01/29/19 0932  BP: 113/66  Pulse: 80  Weight: 156 lb (70.8 kg)  Body mass index is 28.53 kg/m.        Physical Examination:   General appearance: Well appearing, and in no distress  Mental status: Alert, oriented to person, place, and time  Skin: Warm & dry  Cardiovascular: Normal heart rate noted  Respiratory: Normal respiratory effort, no distress  Abdomen: Soft, gravid, nontender  Pelvic: Cervical exam deferred         Extremities: Edema: None  Fetal Status: Fetal Heart Rate (bpm): 147 Fundal Height: 29 cm Movement: Present    Chaperone: n/a    No results found for this or any previous visit (from the past 24 hour(s)).  Assessment & Plan:  1) Low-risk pregnancy G2P1001 at [redacted]w[redacted]d with an Estimated Date of Delivery: 04/17/19      Meds: No orders of the defined types were placed in this encounter.  Labs/procedures today: PN2 today  Plan:  Continue routine obstetrical care  Next visit: prefers online    Reviewed: Preterm labor symptoms and general  obstetric precautions including but not limited to vaginal bleeding, contractions, leaking of fluid and fetal movement were reviewed in detail with the patient.  All questions were answered. Has home bp cuff. Rx faxed to . Check bp weekly, let us know if >140/90.   Follow-up: Return in about 3 weeks (around 02/19/2019) for [place] visit, LROB.  Orders Placed This Encounter  Procedures  . POC Urinalysis Dipstick OB   Florian Buff  01/29/2019 9:56 AM

## 2019-01-30 LAB — CBC
Hematocrit: 29 % — ABNORMAL LOW (ref 34.0–46.6)
Hemoglobin: 9.4 g/dL — ABNORMAL LOW (ref 11.1–15.9)
MCH: 26.5 pg — ABNORMAL LOW (ref 26.6–33.0)
MCHC: 32.4 g/dL (ref 31.5–35.7)
MCV: 82 fL (ref 79–97)
Platelets: 148 10*3/uL — ABNORMAL LOW (ref 150–450)
RBC: 3.55 x10E6/uL — ABNORMAL LOW (ref 3.77–5.28)
RDW: 14.3 % (ref 11.7–15.4)
WBC: 4.9 10*3/uL (ref 3.4–10.8)

## 2019-01-30 LAB — GLUCOSE TOLERANCE, 2 HOURS W/ 1HR
Glucose, 1 hour: 139 mg/dL (ref 65–179)
Glucose, 2 hour: 96 mg/dL (ref 65–152)
Glucose, Fasting: 76 mg/dL (ref 65–91)

## 2019-01-30 LAB — ANTIBODY SCREEN: Antibody Screen: NEGATIVE

## 2019-01-30 LAB — HIV ANTIBODY (ROUTINE TESTING W REFLEX): HIV Screen 4th Generation wRfx: NONREACTIVE

## 2019-01-30 LAB — RPR: RPR Ser Ql: NONREACTIVE

## 2019-02-01 ENCOUNTER — Other Ambulatory Visit: Payer: Self-pay | Admitting: Women's Health

## 2019-02-01 DIAGNOSIS — Z3483 Encounter for supervision of other normal pregnancy, third trimester: Secondary | ICD-10-CM

## 2019-02-01 MED ORDER — FERROUS SULFATE 325 (65 FE) MG PO TABS: 325.0000 mg | ORAL_TABLET | Freq: Two times a day (BID) | ORAL | 3 refills | Status: DC

## 2019-02-15 ENCOUNTER — Telehealth: Payer: Self-pay | Admitting: *Deleted

## 2019-02-15 NOTE — Telephone Encounter (Signed)
Patient informed iron down to 9.4.  Taylor Poole has sent in iron supplements for her to take along with her PNV daily.  Pt states she is only taking prenatal every other day.  Encouraged patient to take daily and increase iron rich foods.  Also made aware that platelets were down to 148 so it will be rechecked later in her pregnancy.  Pt verbalized understanding and stated she would pick up iron tablets today and start taking.

## 2019-02-17 ENCOUNTER — Telehealth: Payer: Self-pay | Admitting: *Deleted

## 2019-02-17 NOTE — Telephone Encounter (Signed)
Pt aware ferrous sulfate is OTC and she don't need a prescription for it. Pt voiced understanding. Leeper

## 2019-02-17 NOTE — Telephone Encounter (Signed)
Pt called stating she was told her blood level is low and she has iron pills at the pharmacy but the pharmacy states they don't have prescription. Please advise. Thanks!!

## 2019-02-19 ENCOUNTER — Encounter: Payer: Self-pay | Admitting: Women's Health

## 2019-02-19 ENCOUNTER — Telehealth (INDEPENDENT_AMBULATORY_CARE_PROVIDER_SITE_OTHER): Payer: Medicaid Other | Admitting: Women's Health

## 2019-02-19 ENCOUNTER — Other Ambulatory Visit: Payer: Self-pay

## 2019-02-19 VITALS — BP 120/68 | HR 82

## 2019-02-19 DIAGNOSIS — Z3483 Encounter for supervision of other normal pregnancy, third trimester: Secondary | ICD-10-CM

## 2019-02-19 DIAGNOSIS — Z3A31 31 weeks gestation of pregnancy: Secondary | ICD-10-CM

## 2019-02-19 NOTE — Progress Notes (Signed)
   TELEHEALTH VIRTUAL OBSTETRICS VISIT ENCOUNTER NOTE Patient name: Taylor Poole MRN [id number]  Date of birth: 2001/03/11  I connected with patient on 02/19/19 at  9:10 AM EST by MyChart video  and verified that I am speaking with the correct person using two identifiers. Due to COVID-19 recommendations, pt is not currently in our office.    I discussed the limitations, risks, security and privacy concerns of performing an evaluation and management service by telephone and the availability of in person appointments. I also discussed with the patient that there may be a patient responsible charge related to this service. The patient expressed understanding and agreed to proceed.  Chief Complaint:   Routine Prenatal Visit  History of Present Illness:   Taylor Poole is a 18 y.o. G50P1001 female at [redacted]w[redacted]d with an Estimated Date of Delivery: 04/17/19 being evaluated today for ongoing management of a low-risk pregnancy.  Today she reports hasn't picked up fe yet, hgb 9.4, craving and eating lots of ice. Concerned about lifting arms above head, cord being wrapped around baby's neck-discussed this was old wives tale and not true.  Contractions: Not present. Vag. Bleeding: None.  Movement: Present. denies leaking of fluid. Review of Systems:   Pertinent items are noted in HPI Denies abnormal vaginal discharge w/ itching/odor/irritation, headaches, visual changes, shortness of breath, chest pain, abdominal pain, severe nausea/vomiting, or problems with urination or bowel movements unless otherwise stated above. Pertinent History Reviewed:  Reviewed past medical,surgical, social, obstetrical and family history.  Reviewed problem list, medications and allergies. Physical Assessment:   Vitals:   02/19/19 0856  BP: 120/68  Pulse: 82  There is no height or weight on file to calculate BMI.        Physical Examination:   General:  Alert, oriented and cooperative.   Mental Status: Normal mood and affect  perceived. Normal judgment and thought content.  Rest of physical exam deferred due to type of encounter  No results found for this or any previous visit (from the past 24 hour(s)).  Assessment & Plan:  1) Pregnancy G2P1001 at [redacted]w[redacted]d with an Estimated Date of Delivery: 04/17/19   2) Anemia w/ PICA, start fe bid w/ OJ today   Meds: No orders of the defined types were placed in this encounter.  Labs/procedures today: none  Plan:  Continue routine obstetrical care.  Has home bp cuff.  Check bp weekly, let us know if >140/90.  Next visit: prefers online    Reviewed: Preterm labor symptoms and general obstetric precautions including but not limited to vaginal bleeding, contractions, leaking of fluid and fetal movement were reviewed in detail with the patient. The patient was advised to call back or seek an in-person office evaluation/go to [facility] at [facility] for any urgent or concerning symptoms. All questions were answered. Please refer to After Visit Summary for other counseling recommendations.    I provided 15 minutes of non-face-to-face time during this encounter.  Follow-up: No follow-ups on file.  No orders of the defined types were placed in this encounter.  [place], [facility] 02/19/2019 9:33 AM

## 2019-02-19 NOTE — Patient Instructions (Signed)
Taylor Poole, I greatly value your feedback.  If you receive a survey following your visit with [place] today, we appreciate you taking the time to fill it out.  Thanks, Knute Neu, CNM, [facility]  [place]!!! It is now [place] at [facility] ([place], [place] [address]) Entrance located off of [address] parking    Go to [email or web address] to register for FREE online childbirth classes   Call the office [phone]) or go to [facility] if:  You begin to have strong, frequent contractions  Your water breaks.  Sometimes it is a big gush of fluid, sometimes it is just a trickle that keeps getting your panties wet or running down your legs  You have vaginal bleeding.  It is normal to have a small amount of spotting if your cervix was checked.   You don't feel your baby moving like normal.  If you don't, get you something to eat and drink and lay down and focus on feeling your baby move.  You should feel at least 10 movements in 2 hours.  If you don't, you should call the office or go to [facility].    Tdap Vaccine  It is recommended that you get the Tdap vaccine during the third trimester of EACH pregnancy to help protect your baby from getting pertussis (whooping cough)  27-36 weeks is the BEST time to do this so that you can pass the protection on to your baby. During pregnancy is better than after pregnancy, but if you are unable to get it during pregnancy it will be offered at the hospital.   You can get this vaccine with [place], at the health department, your family doctor, or some local pharmacies  Everyone who will be around your baby should also be up-to-date on their vaccines before the baby comes. Adults (who are not pregnant) only need 1 dose of Tdap during adulthood.   [place] Pediatricians/Family Doctors:  [facility] [phone]                  [place] [phone] (usually not accepting new patients unless you have family there already, you are always welcome to call and ask)       [facility] Department [phone]       [facility] Pediatricians/Family Doctors:   [facility]: [phone]  [facility]: [phone]  [facility]: [place] Doctors:   [facility]: [facility]: [place]:  [place]: [phone]   Home Blood Pressure Monitoring for Patients   Your provider has recommended that you check your blood pressure (BP) at least once a week at home. If you do not have a blood pressure cuff at home, one will be provided for you. Contact your provider if you have not received your monitor within 1 week.   Helpful Tips for Accurate Home Blood Pressure Checks  . Don't smoke, exercise, or drink caffeine 30 minutes before checking your BP . Use the restroom before checking your BP (a full bladder can raise your pressure) . Relax in a comfortable upright chair . Feet on the ground . Left arm resting comfortably on a flat surface at the level of your heart . Legs uncrossed . Back supported . Sit quietly and don't talk .  Place the cuff on your bare arm . Adjust snuggly, so that only two fingertips can fit between your skin and the top of the cuff . Check 2 readings separated by at least one minute . Keep a log of your BP readings . For a visual, please reference this diagram: http://ccnc.care/bpdiagram  Provider Name: [facility]     Phone: [phone]  Zone 1: ALL CLEAR  Continue to monitor your symptoms:  . BP reading is less than 140 (top number) or less than 90 (bottom number)  . No right upper stomach pain . No headaches or seeing spots . No feeling nauseated or throwing up . No swelling in face and  hands  Zone 2: CAUTION Call your doctor's office for any of the following:  . BP reading is greater than 140 (top number) or greater than 90 (bottom number)  . Stomach pain under your ribs in the middle or right side . Headaches or seeing spots . Feeling nauseated or throwing up . Swelling in face and hands  Zone 3: EMERGENCY  Seek immediate medical care if you have any of the following:  . BP reading is greater than160 (top number) or greater than 110 (bottom number) . Severe headaches not improving with Tylenol . Serious difficulty catching your breath . Any worsening symptoms from Zone 2   Third Trimester of Pregnancy The third trimester is from week 29 through week 42, months 7 through 9. The third trimester is a time when the fetus is growing rapidly. At the end of the ninth month, the fetus is about 20 inches in length and weighs 6-10 pounds.  BODY CHANGES Your body goes through many changes during pregnancy. The changes vary from woman to woman.   Your weight will continue to increase. You can expect to gain 25-35 pounds (11-16 kg) by the end of the pregnancy.  You may begin to get stretch marks on your hips, abdomen, and breasts.  You may urinate more often because the fetus is moving lower into your pelvis and pressing on your bladder.  You may develop or continue to have heartburn as a result of your pregnancy.  You may develop constipation because certain hormones are causing the muscles that push waste through your intestines to slow down.  You may develop hemorrhoids or swollen, bulging veins (varicose veins).  You may have pelvic pain because of the weight gain and pregnancy hormones relaxing your joints between the bones in your pelvis. Backaches may result from overexertion of the muscles supporting your posture.  You may have changes in your hair. These can include thickening of your hair, rapid growth, and changes in texture. Some women also have hair loss during  or after pregnancy, or hair that feels dry or thin. Your hair will most likely return to normal after your baby is born.  Your breasts will continue to grow and be tender. A yellow discharge may leak from your breasts called colostrum.  Your belly button may stick out.  You may feel short of breath because of your expanding uterus.  You may notice the fetus "dropping," or moving lower in your abdomen.  You may have a bloody mucus discharge. This usually occurs a few days to a week before labor begins.  Your cervix becomes thin and soft (effaced) near your due date. WHAT TO EXPECT AT YOUR PRENATAL EXAMS  You will have prenatal exams every 2 weeks until week 36. Then, you will have weekly prenatal exams. During  a routine prenatal visit:  You will be weighed to make sure you and the fetus are growing normally.  Your blood pressure is taken.  Your abdomen will be measured to track your baby's growth.  The fetal heartbeat will be listened to.  Any test results from the previous visit will be discussed.  You may have a cervical check near your due date to see if you have effaced. At around 36 weeks, your caregiver will check your cervix. At the same time, your caregiver will also perform a test on the secretions of the vaginal tissue. This test is to determine if a type of bacteria, Group B streptococcus, is present. Your caregiver will explain this further. Your caregiver may ask you:  What your birth plan is.  How you are feeling.  If you are feeling the baby move.  If you have had any abnormal symptoms, such as leaking fluid, bleeding, severe headaches, or abdominal cramping.  If you have any questions. Other tests or screenings that may be performed during your third trimester include:  Blood tests that check for low iron levels (anemia).  Fetal testing to check the health, activity level, and growth of the fetus. Testing is done if you have certain medical conditions or if  there are problems during the pregnancy. FALSE LABOR You may feel small, irregular contractions that eventually go away. These are called Braxton Hicks contractions, or false labor. Contractions may last for hours, days, or even weeks before true labor sets in. If contractions come at regular intervals, intensify, or become painful, it is best to be seen by your caregiver.  SIGNS OF LABOR   Menstrual-like cramps.  Contractions that are 5 minutes apart or less.  Contractions that start on the top of the uterus and spread down to the lower abdomen and back.  A sense of increased pelvic pressure or back pain.  A watery or bloody mucus discharge that comes from the vagina. If you have any of these signs before the 37th week of pregnancy, call your caregiver right away. You need to go to the hospital to get checked immediately. HOME CARE INSTRUCTIONS   Avoid all smoking, herbs, alcohol, and unprescribed drugs. These chemicals affect the formation and growth of the baby.  Follow your caregiver's instructions regarding medicine use. There are medicines that are either safe or unsafe to take during pregnancy.  Exercise only as directed by your caregiver. Experiencing uterine cramps is a good sign to stop exercising.  Continue to eat regular, healthy meals.  Wear a good support bra for breast tenderness.  Do not use hot tubs, steam rooms, or saunas.  Wear your seat belt at all times when driving.  Avoid raw meat, uncooked cheese, cat litter boxes, and soil used by cats. These carry germs that can cause birth defects in the baby.  Take your prenatal vitamins.  Try taking a stool softener (if your caregiver approves) if you develop constipation. Eat more high-fiber foods, such as fresh vegetables or fruit and whole grains. Drink plenty of fluids to keep your urine clear or pale yellow.  Take warm sitz baths to soothe any pain or discomfort caused by hemorrhoids. Use hemorrhoid cream if your  caregiver approves.  If you develop varicose veins, wear support hose. Elevate your feet for 15 minutes, 3-4 times a day. Limit salt in your diet.  Avoid heavy lifting, wear low heal shoes, and practice good posture.  Rest a lot with your legs elevated if you  have leg cramps or low back pain.  Visit your dentist if you have not gone during your pregnancy. Use a soft toothbrush to brush your teeth and be gentle when you floss.  A sexual relationship may be continued unless your caregiver directs you otherwise.  Do not travel far distances unless it is absolutely necessary and only with the approval of your caregiver.  Take prenatal classes to understand, practice, and ask questions about the labor and delivery.  Make a trial run to the hospital.  Pack your hospital bag.  Prepare the baby's nursery.  Continue to go to all your prenatal visits as directed by your caregiver. SEEK MEDICAL CARE IF:  You are unsure if you are in labor or if your water has broken.  You have dizziness.  You have mild pelvic cramps, pelvic pressure, or nagging pain in your abdominal area.  You have persistent nausea, vomiting, or diarrhea.  You have a bad smelling vaginal discharge.  You have pain with urination. SEEK IMMEDIATE MEDICAL CARE IF:   You have a fever.  You are leaking fluid from your vagina.  You have spotting or bleeding from your vagina.  You have severe abdominal cramping or pain.  You have rapid weight loss or gain.  You have shortness of breath with chest pain.  You notice sudden or extreme swelling of your face, hands, ankles, feet, or legs.  You have not felt your baby move in over an hour.  You have severe headaches that do not go away with medicine.  You have vision changes. Document Released: 02/19/2001 Document Revised: 03/02/2013 Document Reviewed: 04/28/2012 [facility] Patient Information 2015 [place], [place]. This information is not intended to replace advice  given to you by your health care provider. Make sure you discuss any questions you have with your health care provider.  PROTECT YOURSELF & YOUR BABY FROM THE FLU! Because you are pregnant, we at [facility], along with the Centers for Disease Control (CDC), recommend that you receive the flu vaccine to protect yourself and your baby from the flu. The flu is more likely to cause severe illness in pregnant women than in women of reproductive age who are not pregnant. Changes in the immune system, heart, and lungs during pregnancy make pregnant women (and women up to two weeks postpartum) more prone to severe illness from flu, including illness resulting in hospitalization. Flu also may be harmful for a pregnant woman's developing baby. A common flu symptom is fever, which may be associated with neural tube defects and other adverse outcomes for a developing baby. Getting vaccinated can also help protect a baby after birth from flu. (Mom passes antibodies onto the developing baby during her pregnancy.)  A Flu Vaccine is the Best Protection Against Flu Getting a flu vaccine is the first and most important step in protecting against flu. Pregnant women should get a flu shot and not the live attenuated influenza vaccine (LAIV), also known as nasal spray flu vaccine. Flu vaccines given during pregnancy help protect both the mother and her baby from flu. Vaccination has been shown to reduce the risk of flu-associated acute respiratory infection in pregnant women by up to one-half. A 2018 study showed that getting a flu shot reduced a pregnant woman's risk of being hospitalized with flu by an average of 40 percent. Pregnant women who get a flu vaccine are also helping to protect their babies from flu illness for the first several months after their birth, when they are too  young to get vaccinated.   A Long Record of Safety for Flu Shots in Pregnant Women Flu shots have been given to millions of pregnant women over  many years with a good safety record. There is a lot of evidence that flu vaccines can be given safely during pregnancy; though these data are limited for the first trimester. The CDC recommends that pregnant women get vaccinated during any trimester of their pregnancy. It is very important for pregnant women to get the flu shot.   Other Preventive Actions In addition to getting a flu shot, pregnant women should take the same everyday preventive actions the CDC recommends of everyone, including covering coughs, washing hands often, and avoiding people who are sick.  Symptoms and Treatment If you get sick with flu symptoms call your doctor right away. There are antiviral drugs that can treat flu illness and prevent serious flu complications. The CDC recommends prompt treatment for people who have influenza infection or suspected influenza infection and who are at high risk of serious flu complications, such as people with asthma, diabetes (including gestational diabetes), or heart disease. Early treatment of influenza in hospitalized pregnant women has been shown to reduce the length of the hospital stay.  Symptoms Flu symptoms include fever, cough, sore throat, runny or stuffy nose, body aches, headache, chills and fatigue. Some people may also have vomiting and diarrhea. People may be infected with the flu and have respiratory symptoms without a fever.  Early Treatment is Important for Pregnant Women Treatment should begin as soon as possible because antiviral drugs work best when started early (within 48 hours after symptoms start). Antiviral drugs can make your flu illness milder and make you feel better faster. They may also prevent serious health problems that can result from flu illness. Oral oseltamivir (Tamiflu) is the preferred treatment for pregnant women because it has the most studies available to suggest that it is safe and beneficial. Antiviral drugs require a prescription from your  provider. Having a fever caused by flu infection or other infections early in pregnancy may be linked to birth defects in a baby. In addition to taking antiviral drugs, pregnant women who get a fever should treat their fever with Tylenol (acetaminophen) and contact their provider immediately.  When to [place] If you are pregnant and have any of these signs, seek care immediately:  Difficulty breathing or shortness of breath  Pain or pressure in the chest or abdomen  Sudden dizziness  Confusion  Severe or persistent vomiting  High fever that is not responding to Tylenol (or store brand equivalent)  Decreased or no movement of your baby  [email or web address]

## 2019-03-08 ENCOUNTER — Encounter: Payer: Self-pay | Admitting: Advanced Practice Midwife

## 2019-03-08 ENCOUNTER — Other Ambulatory Visit: Payer: Self-pay

## 2019-03-08 ENCOUNTER — Encounter: Payer: Self-pay | Admitting: *Deleted

## 2019-03-08 ENCOUNTER — Telehealth (INDEPENDENT_AMBULATORY_CARE_PROVIDER_SITE_OTHER): Payer: Medicaid Other | Admitting: Advanced Practice Midwife

## 2019-03-08 VITALS — BP 120/74 | HR 77 | Ht 62.25 in

## 2019-03-08 DIAGNOSIS — Z3483 Encounter for supervision of other normal pregnancy, third trimester: Secondary | ICD-10-CM

## 2019-03-08 DIAGNOSIS — Z3A34 34 weeks gestation of pregnancy: Secondary | ICD-10-CM | POA: Diagnosis not present

## 2019-03-08 NOTE — Progress Notes (Signed)
   TELEHEALTH VIRTUAL OBSTETRICS VISIT ENCOUNTER NOTE  I connected with Taylor Poole on 03/08/19 at  2:50 PM EST by telephone at home and verified that I am speaking with the correct person using two identifiers.   I discussed the limitations, risks, security and privacy concerns of performing an evaluation and management service by telephone and the availability of in person appointments. I also discussed with the patient that there may be a patient responsible charge related to this service. The patient expressed understanding and agreed to proceed.  Subjective:  Taylor Poole is a 18 y.o. G2P1001 at [redacted]w[redacted]d being followed for ongoing prenatal care.  She is currently monitored for the following issues for this low-risk pregnancy and has Cannabinoid hyperemesis syndrome; Hypokalemia; Monilial vaginitis; Abdominal trauma; H/O Gestational thrombocytopenia ([place]); Supervision of normal pregnancy; and Marijuana use on their problem list.  Patient reports some vaginal soreness (not like an infection, more like pressure).would like IOL at 37 weeks, discussed that you must have a medical reason.  Reports fetal movement. Denies any contractions, bleeding or leaking of fluid.   The following portions of the patient's history were reviewed and updated as appropriate: allergies, current medications, past family history, past medical history, past social history, past surgical history and problem list.   Objective:   General:  Alert, oriented and cooperative.   Mental Status: Normal mood and affect perceived. Normal judgment and thought content.  Rest of physical exam deferred due to type of encounter  Assessment and Plan:  Pregnancy: G2P1001 at [redacted]w[redacted]d 1. Encounter for supervision of other normal pregnancy in third trimester   Preterm labor symptoms and general obstetric precautions including but not limited to vaginal bleeding, contractions, leaking of fluid and fetal movement were reviewed in detail  with the patient.  I discussed the assessment and treatment plan with the patient. The patient was provided an opportunity to ask questions and all were answered. The patient agreed with the plan and demonstrated an understanding of the instructions. The patient was advised to call back or seek an in-person office evaluation/go to [facility] at [facility] for any urgent or concerning symptoms. Please refer to After Visit Summary for other counseling recommendations.   I provided 10 minutes of non-face-to-face time during this encounter.  Return in about 2 weeks (around 03/22/2019) for LROB.  No future appointments.  Christin Fudge, [place] for [employer], [place]

## 2019-03-08 NOTE — Patient Instructions (Signed)
Taylor Poole, I greatly value your feedback.  If you receive a survey following your visit with [place] today, we appreciate you taking the time to fill it out.  Thanks, Taylor Berthold, DNP, CNM  [place]!!! It is now [place] at [facility] ([place], [place] [address]) Entrance located off of [place] parking   Go to [email or web address] to register for FREE online childbirth classes    Call the office [phone]) or go to [place] if:  You begin to have strong, frequent contractions  Your water breaks.  Sometimes it is a big gush of fluid, sometimes it is just a trickle that keeps getting your panties wet or running down your legs  You have vaginal bleeding.  It is normal to have a small amount of spotting if your cervix was checked.   You don't feel your baby moving like normal.  If you don't, get you something to eat and drink and lay down and focus on feeling your baby move.  You should feel at least 10 movements in 2 hours.  If you don't, you should call the office or go to [place] Blood Pressure Monitoring for Patients   Your provider has recommended that you check your blood pressure (BP) at least once a week at home. If you do not have a blood pressure cuff at home, one will be provided for you. Contact your provider if you have not received your monitor within 1 week.   Helpful Tips for Accurate Home Blood Pressure Checks  . Don't smoke, exercise, or drink caffeine 30 minutes before checking your BP . Use the restroom before checking your BP (a full bladder can raise your pressure) . Relax in a comfortable upright chair . Feet on the ground . Left arm resting comfortably on a flat surface at the level of your heart . Legs uncrossed . Back supported . Sit quietly and don't talk . Place the cuff on your bare arm . Adjust snuggly, so that only two fingertips can fit  between your skin and the top of the cuff . Check 2 readings separated by at least one minute . Keep a log of your BP readings . For a visual, please reference this diagram: http://ccnc.care/bpdiagram  Provider Name: [facility]     Phone: [phone]  Zone 1: ALL CLEAR  Continue to monitor your symptoms:  . BP reading is less than 140 (top number) or less than 90 (bottom number)  . No right upper stomach pain . No headaches or seeing spots . No feeling nauseated or throwing up . No swelling in face and hands  Zone 2: CAUTION Call your doctor's office for any of the following:  . BP reading is greater than 140 (top number) or greater than 90 (bottom number)  . Stomach pain under your ribs in the middle or right side . Headaches or seeing spots . Feeling nauseated or throwing up . Swelling in face and hands  Zone 3: EMERGENCY  Seek immediate medical care if you have any of the following:  . BP reading is greater than160 (top number) or greater than 110 (bottom number) . Severe headaches not improving with Tylenol . Serious difficulty catching your breath . Any worsening symptoms from Zone 2

## 2019-03-12 NOTE — L&D Delivery Note (Addendum)
OB/GYN Faculty Practice Delivery Note  Taylor Poole is a 19 y.o. G2P1001 s/p SVD at [redacted]w[redacted]d. She was admitted for active labor.   ROM: 3h 40m with meconium stained fluid GBS Status:  --Theda Sers (01/11 1222)  Labor Progress: Patient presented to L&D for active labor. Initial SVE: 3. She then progressed to complete.   Delivery Date/Time: 04/05/2019 @ 1808 Delivery: Called to room and patient was complete and pushing. Head delivered without difficulty. No nuchal cord present. Shoulder delivery with gentle downward traction and body delivered in usual fashion. Infant with spontaneous cry, placed on mother's abdomen, dried and stimulated. Cord clamped x 2 after 1-minute delay, and cut by FOB. Cord blood drawn. Placenta delivered spontaneously with gentle cord traction. Fundus firm with massage and Pitocin. Labia, perineum, vagina, and cervix inspected inspected, 1st degree perineal laceration, which was hemostatic.  Periurethral also present and repaired with 3-0 vicryl rapide.  Mother and baby stable and bonding.    Placenta: Sent to L&D Complications: None Lacerations: 1st degree, hemostatic.  Periurethral repaired EBL: 100 Analgesia: Epidural   Infant: APGAR (1 MIN): 9   APGAR (5 MINS): 9    Sherlyn Lees, SNM, RNC-OB  Midwife attestation: I was gloved and present for delivery in its entirety and I agree with the above student's note.  Donette Larry, CNM 6:55 PM

## 2019-03-12 NOTE — L&D Delivery Note (Deleted)
OBSTETRIC ADMISSION HISTORY AND PHYSICAL  Taylor Poole is a 19 y.o. female G2P1001 with IUP at [redacted]w[redacted]d presenting for active labor. She reports +FMs. No LOF, VB, blurry vision, headaches, peripheral edema, or RUQ pain. She plans on breast feeding. She requests a contraceptive patch for birth control.  Dating: By LMP--->  Estimated Date of Delivery: 04/17/19  Prenatal History/Complications: Low risk pregnancy  Past Medical History: Past Medical History:  Diagnosis Date  . Anemia   . Dehydration 10/09/2016  . Depression   . Medical history non-contributory     Past Surgical History: Past Surgical History:  Procedure Laterality Date  . NO PAST SURGERIES    . WISDOM TOOTH EXTRACTION  2015    Obstetrical History: OB History    Gravida  2   Para  1   Term  1   Preterm      AB      Living  1     SAB      TAB      Ectopic      Multiple      Live Births  1           Social History: Social History   Socioeconomic History  . Marital status: Single    Spouse name: Database administrator  . Number of children: 1  . Years of education: 72  . Highest education level: 11th grade  Occupational History  . Not on file  Tobacco Use  . Smoking status: Never Smoker  . Smokeless tobacco: Never Used  Substance and Sexual Activity  . Alcohol use: No  . Drug use: Not Currently    Types: Marijuana  . Sexual activity: Yes    Birth control/protection: None  Other Topics Concern  . Not on file  Social History Narrative  . Not on file   Social Determinants of Health   Financial Resource Strain: Medium Risk  . Difficulty of Paying Living Expenses: Somewhat hard  Food Insecurity: No Food Insecurity  . Worried About Charity fundraiser in the Last Year: Never true  . Ran Out of Food in the Last Year: Never true  Transportation Needs: No Transportation Needs  . Lack of Transportation (Medical): No  . Lack of Transportation (Non-Medical): No  Physical Activity: Insufficiently  Active  . Days of Exercise per Week: 3 days  . Minutes of Exercise per Session: 20 min  Stress: No Stress Concern Present  . Feeling of Stress : Only a little  Social Connections: Somewhat Isolated  . Frequency of Communication with Friends and Family: Once a week  . Frequency of Social Gatherings with Friends and Family: Never  . Attends Religious Services: 1 to 4 times per year  . Active Member of Clubs or Organizations: No  . Attends Archivist Meetings: Never  . Marital Status: Living with partner    Family History: Family History  Problem Relation Age of Onset  . Cancer Maternal Grandfather     Allergies: No Known Allergies  No medications prior to admission.     Review of Systems:  All systems reviewed and negative except as stated in HPI  PE: Blood pressure 127/86, pulse 70, temperature (!) 97.5 F (36.4 C), temperature source Oral, resp. rate 16, height 5\' 2"  (1.575 m), weight 72.6 kg, not currently breastfeeding. General appearance: alert, cooperative and no distress Lungs: regular rate and effort Heart: regular rate  Abdomen: soft, non-tender Extremities: Homans sign is negative, no sign of DVT Presentation: cephalic  EFM: 120 bpm, moderate variability, accels, no decels Toco: every 2-4 minutes Dilation: 4 Effacement (%): 80 Station: -2 Exam by:: Ginger Morris RN  Prenatal labs: ABO, Rh: A/Positive/-- (08/03 1447) Antibody: Negative (11/20 0919) Rubella: 9.52 (08/03 1447) RPR: Non Reactive (11/20 0919)  HBsAg: Negative (08/03 1447)  HIV: Non Reactive (11/20 0919)  GBS: --Theda Sers (01/11 1222)   Prenatal Transfer Tool  Maternal Diabetes: No Genetic Screening: Normal Maternal Ultrasounds/Referrals: Normal Fetal Ultrasounds or other Referrals:  None Maternal Substance Abuse:  Yes:  Type: Marijuana Significant Maternal Medications:  None Significant Maternal Lab Results: Group B Strep negative  No results found for this or any previous  visit (from the past 24 hour(s)).  Patient Active Problem List   Diagnosis Date Noted  . Indication for care in labor or delivery 04/05/2019  . 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 Supervision of normal pregnancy 10/12/2018  . H/O Gestational thrombocytopenia (HCC) 02/12/2018    Assessment: Taylor Poole is a 19 y.o. G2P1001 at [redacted]w[redacted]d here for labor  1. Labor: latent 2. FWB: Reassuring, category 1 3. Pain: Epidural 4. GBS: Negative   Plan: Admit to labor and delivery  -May have fentanyl PRN -May have epidural. -SVE in 2 hours to determine if should consider augmentation.  Felipa Eth, Student-MidWife  04/05/2019, 11:34 AM

## 2019-03-14 ENCOUNTER — Other Ambulatory Visit: Payer: Self-pay

## 2019-03-14 ENCOUNTER — Encounter (HOSPITAL_COMMUNITY): Payer: Self-pay | Admitting: Emergency Medicine

## 2019-03-14 ENCOUNTER — Emergency Department (HOSPITAL_COMMUNITY)
Admission: EM | Admit: 2019-03-14 | Discharge: 2019-03-14 | Disposition: A | Discharge status: Home / Self Care | Payer: Medicaid Other | Attending: Emergency Medicine | Admitting: Emergency Medicine

## 2019-03-14 DIAGNOSIS — N949 Unspecified condition associated with female genital organs and menstrual cycle: Secondary | ICD-10-CM

## 2019-03-14 DIAGNOSIS — O26893 Other specified pregnancy related conditions, third trimester: Secondary | ICD-10-CM | POA: Insufficient documentation

## 2019-03-14 DIAGNOSIS — F1210 Cannabis abuse, uncomplicated: Secondary | ICD-10-CM | POA: Insufficient documentation

## 2019-03-14 DIAGNOSIS — O23593 Infection of other part of genital tract in pregnancy, third trimester: Secondary | ICD-10-CM | POA: Diagnosis not present

## 2019-03-14 DIAGNOSIS — N898 Other specified noninflammatory disorders of vagina: Secondary | ICD-10-CM | POA: Insufficient documentation

## 2019-03-14 DIAGNOSIS — Z3A35 35 weeks gestation of pregnancy: Secondary | ICD-10-CM | POA: Diagnosis not present

## 2019-03-14 DIAGNOSIS — Z79899 Other long term (current) drug therapy: Secondary | ICD-10-CM | POA: Diagnosis not present

## 2019-03-14 DIAGNOSIS — R102 Pelvic and perineal pain: Secondary | ICD-10-CM | POA: Diagnosis not present

## 2019-03-14 LAB — URINALYSIS, ROUTINE W REFLEX MICROSCOPIC
Bilirubin Urine: NEGATIVE
Glucose, UA: NEGATIVE mg/dL
Hgb urine dipstick: NEGATIVE
Ketones, ur: NEGATIVE mg/dL
Leukocytes,Ua: NEGATIVE
Nitrite: NEGATIVE
Protein, ur: NEGATIVE mg/dL
Specific Gravity, Urine: 1.009 (ref 1.005–1.030)
pH: 7 (ref 5.0–8.0)

## 2019-03-14 LAB — WET PREP, GENITAL
Clue Cells Wet Prep HPF POC: NONE SEEN
Sperm: NONE SEEN
Trich, Wet Prep: NONE SEEN
Yeast Wet Prep HPF POC: NONE SEEN

## 2019-03-14 NOTE — ED Notes (Signed)
Pt cleared from OB standpoint.

## 2019-03-14 NOTE — Progress Notes (Addendum)
[facility] RN called regarding G2P1 pt at [redacted]w[redacted]d presenting with vaginal pain x1.5 weeks.  Pt goes to [facility], reports that her next OB appt is on 1/12.  According to primary RN, pt denies contractions, bleeding, and LOF.  Being placed on the monitor right now.  2010 - spoke with Dr. Earlene Plater and discussed strip, MD would like at least four more minutes of strip, after that pt is OB cleared for discharge.  2027 - called [facility] ED, sufficient fetal monitoring, pt is OB cleared for discharge.

## 2019-03-14 NOTE — Discharge Instructions (Signed)
You were seen in the ER for vaginal pain and discharge  No signs of cervical dilation today  Swabs were normal, no yeast or BV or trichomonas.  Gonorrhea and chlamydia test results will be posted on your MyChart. Urine was normal.   I suspect you are having normal pregnancy related vaginal discharge, non infectious.  This increases closer to full term and delivery.   Cause of your vaginal pain is unclear but likely due to anatomical changes due to baby growing and dropping, possibly round ligament pain. Some gentle stretches may help.    Follow up with OB as scheduled  Return for fever, worsening constant pelvic pain, worsening vaginal discharge or bleeding or fluid leaking, urinary tract infection symptoms

## 2019-03-14 NOTE — ED Notes (Signed)
Pt placed on fetal monitor and called report to [facility] nurse

## 2019-03-14 NOTE — ED Triage Notes (Signed)
Patient complains of vaginal pain (35 weeks and 2 days pregnant with 2nd child) for the past week and a half. Patient states that she feels pain when getting up from a sitting or lying position. Next OB appt. Is January 12th at [facility].
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
## 2019-03-16 LAB — URINE CULTURE: Culture: NO GROWTH

## 2019-03-16 LAB — GC/CHLAMYDIA PROBE AMP (~~LOC~~) NOT AT ARMC
Chlamydia: NEGATIVE
Neisseria Gonorrhea: NEGATIVE

## 2019-03-22 ENCOUNTER — Ambulatory Visit (INDEPENDENT_AMBULATORY_CARE_PROVIDER_SITE_OTHER): Payer: Medicaid Other | Admitting: Advanced Practice Midwife

## 2019-03-22 ENCOUNTER — Other Ambulatory Visit: Payer: Self-pay

## 2019-03-22 VITALS — BP 120/71 | HR 68

## 2019-03-22 DIAGNOSIS — Z1389 Encounter for screening for other disorder: Secondary | ICD-10-CM

## 2019-03-22 DIAGNOSIS — D696 Thrombocytopenia, unspecified: Secondary | ICD-10-CM | POA: Diagnosis not present

## 2019-03-22 DIAGNOSIS — Z23 Encounter for immunization: Secondary | ICD-10-CM

## 2019-03-22 DIAGNOSIS — Z331 Pregnant state, incidental: Secondary | ICD-10-CM

## 2019-03-22 DIAGNOSIS — O99119 Other diseases of the blood and blood-forming organs and certain disorders involving the immune mechanism complicating pregnancy, unspecified trimester: Secondary | ICD-10-CM | POA: Diagnosis not present

## 2019-03-22 DIAGNOSIS — O99013 Anemia complicating pregnancy, third trimester: Secondary | ICD-10-CM | POA: Diagnosis not present

## 2019-03-22 DIAGNOSIS — Z3A36 36 weeks gestation of pregnancy: Secondary | ICD-10-CM | POA: Diagnosis not present

## 2019-03-22 DIAGNOSIS — Z3483 Encounter for supervision of other normal pregnancy, third trimester: Secondary | ICD-10-CM

## 2019-03-22 LAB — POCT URINALYSIS DIPSTICK OB
Blood, UA: NEGATIVE
Glucose, UA: NEGATIVE
Ketones, UA: NEGATIVE
Leukocytes, UA: NEGATIVE
Nitrite, UA: NEGATIVE
POC,PROTEIN,UA: NEGATIVE

## 2019-03-22 NOTE — Patient Instructions (Signed)

## 2019-03-22 NOTE — Progress Notes (Signed)
   LOW-RISK PREGNANCY VISIT Patient name: Taylor Poole MRN [id number]  Date of birth: [birth year] Chief Complaint:   Routine Prenatal Visit  History of Present Illness:   Taylor Poole is a 19 y.o. G46P1001 female at [redacted]w[redacted]d with an Estimated Date of Delivery: 04/17/19 being seen today for ongoing management of a low-risk pregnancy.  Today she reports no complaints. Contractions: Not present. Vag. Bleeding: None.  Movement: Present. denies leaking of fluid. Still eats ice, not taking iron (forgets) Review of Systems:   Pertinent items are noted in HPI Denies abnormal vaginal discharge w/ itching/odor/irritation, headaches, visual changes, shortness of breath, chest pain, abdominal pain, severe nausea/vomiting, or problems with urination or bowel movements unless otherwise stated above. Pertinent History Reviewed:  Reviewed past medical,surgical, social, obstetrical and family history.  Reviewed problem list, medications and allergies. Physical Assessment:   Vitals:   03/22/19 1102  BP: 120/71  Pulse: 68  There is no height or weight on file to calculate BMI.        Physical Examination:   General appearance: Well appearing, and in no distress  Mental status: Alert, oriented to person, place, and time  Skin: Warm & dry  Cardiovascular: Normal heart rate noted  Respiratory: Normal respiratory effort, no distress  Abdomen: Soft, gravid, nontender  Pelvic: Cervical exam performed  Dilation: 1 Effacement (%): Thick Station: Ballotable  Extremities: Edema: None  Fetal Status: Fetal Heart Rate (bpm): 150 Fundal Height: 34 cm Movement: Present Presentation: Vertex  Chaperone: Stoney Bang    Results for orders placed or performed in visit on 03/22/19 (from the past 24 hour(s))  POC Urinalysis Dipstick OB   Collection Time: 03/22/19 11:04 AM  Result Value Ref Range   Color, UA     Clarity, UA     Glucose, UA Negative Negative   Bilirubin, UA     Ketones, UA n    Spec Grav, UA     Blood, UA n    pH, UA     POC,PROTEIN,UA Negative Negative, Trace, Small (1+), Moderate (2+), Large (3+), 4+   Urobilinogen, UA     Nitrite, UA n    Leukocytes, UA Negative Negative   Appearance     Odor      Assessment & Plan:  1) Low-risk pregnancy G2P1001 at [redacted]w[redacted]d with an Estimated Date of Delivery: 04/17/19   2) anemia/mild ITP, recheck labs   Meds: No orders of the defined types were placed in this encounter.  Labs/procedures today: CBC  Plan:  Continue routine obstetrical care  Next visit: prefers in person    Reviewed: Term labor symptoms and general obstetric precautions including but not limited to vaginal bleeding, contractions, leaking of fluid and fetal movement were reviewed in detail with the patient.  All questions were answered. Has home bp cuff. Check bp weekly, let us know if >140/90.   Follow-up: Return in about 1 week (around 03/29/2019) for LROB.  Orders Placed This Encounter  Procedures  . Strep Gp B NAA  . CBC  . POC Urinalysis Dipstick OB   Jacklyn Shell DNP, CNM 03/22/2019 11:29 AM

## 2019-03-22 NOTE — Addendum Note (Signed)
Addended by: Lynnell Dike on: 03/22/2019 11:51 AM   Modules accepted: Orders

## 2019-03-23 LAB — CBC
Hematocrit: 30.7 % — ABNORMAL LOW (ref 34.0–46.6)
Hemoglobin: 9.2 g/dL — ABNORMAL LOW (ref 11.1–15.9)
MCH: 23.2 pg — ABNORMAL LOW (ref 26.6–33.0)
MCHC: 30 g/dL — ABNORMAL LOW (ref 31.5–35.7)
MCV: 77 fL — ABNORMAL LOW (ref 79–97)
Platelets: 133 10*3/uL — ABNORMAL LOW (ref 150–450)
RBC: 3.97 x10E6/uL (ref 3.77–5.28)
RDW: 16 % — ABNORMAL HIGH (ref 11.7–15.4)
WBC: 7.1 10*3/uL (ref 3.4–10.8)

## 2019-03-24 LAB — STREP GP B NAA: Strep Gp B NAA: NEGATIVE

## 2019-03-29 ENCOUNTER — Encounter: Payer: Medicaid Other | Admitting: Obstetrics & Gynecology

## 2019-04-05 ENCOUNTER — Emergency Department (HOSPITAL_COMMUNITY)
Admission: EM | Admit: 2019-04-05 | Discharge: 2019-04-05 | Disposition: A | Discharge status: Home / Self Care | Payer: Medicaid Other | Source: Home / Self Care | Attending: Emergency Medicine | Admitting: Emergency Medicine

## 2019-04-05 ENCOUNTER — Inpatient Hospital Stay (HOSPITAL_COMMUNITY)
Admission: AD | Admit: 2019-04-05 | Discharge: 2019-04-06 | DRG: 806 | Disposition: A | Discharge status: Home / Self Care | Payer: Medicaid Other | Attending: Obstetrics and Gynecology | Admitting: Obstetrics and Gynecology

## 2019-04-05 ENCOUNTER — Encounter (HOSPITAL_COMMUNITY): Payer: Self-pay | Admitting: *Deleted

## 2019-04-05 ENCOUNTER — Encounter (HOSPITAL_COMMUNITY): Payer: Self-pay | Admitting: Obstetrics & Gynecology

## 2019-04-05 ENCOUNTER — Other Ambulatory Visit: Payer: Self-pay

## 2019-04-05 ENCOUNTER — Inpatient Hospital Stay (HOSPITAL_COMMUNITY): Payer: Medicaid Other | Admitting: Anesthesiology

## 2019-04-05 DIAGNOSIS — Z20822 Contact with and (suspected) exposure to covid-19: Secondary | ICD-10-CM | POA: Diagnosis present

## 2019-04-05 DIAGNOSIS — F1290 Cannabis use, unspecified, uncomplicated: Secondary | ICD-10-CM | POA: Diagnosis present

## 2019-04-05 DIAGNOSIS — O9912 Other diseases of the blood and blood-forming organs and certain disorders involving the immune mechanism complicating childbirth: Secondary | ICD-10-CM | POA: Diagnosis present

## 2019-04-05 DIAGNOSIS — O479 False labor, unspecified: Secondary | ICD-10-CM | POA: Diagnosis not present

## 2019-04-05 DIAGNOSIS — O99324 Drug use complicating childbirth: Secondary | ICD-10-CM | POA: Diagnosis present

## 2019-04-05 DIAGNOSIS — D6959 Other secondary thrombocytopenia: Secondary | ICD-10-CM | POA: Diagnosis present

## 2019-04-05 DIAGNOSIS — Z3A38 38 weeks gestation of pregnancy: Secondary | ICD-10-CM | POA: Diagnosis not present

## 2019-04-05 DIAGNOSIS — Z3493 Encounter for supervision of normal pregnancy, unspecified, third trimester: Secondary | ICD-10-CM

## 2019-04-05 DIAGNOSIS — O26893 Other specified pregnancy related conditions, third trimester: Secondary | ICD-10-CM | POA: Diagnosis present

## 2019-04-05 DIAGNOSIS — Z3401 Encounter for supervision of normal first pregnancy, first trimester: Secondary | ICD-10-CM

## 2019-04-05 HISTORY — DX: Anemia, unspecified: D64.9

## 2019-04-05 LAB — CBC
HCT: 32.1 % — ABNORMAL LOW (ref 36.0–46.0)
Hemoglobin: 9.9 g/dL — ABNORMAL LOW (ref 12.0–15.0)
MCH: 23.9 pg — ABNORMAL LOW (ref 26.0–34.0)
MCHC: 30.8 g/dL (ref 30.0–36.0)
MCV: 77.3 fL — ABNORMAL LOW (ref 80.0–100.0)
Platelets: 116 K/uL — ABNORMAL LOW (ref 150–400)
RBC: 4.15 MIL/uL (ref 3.87–5.11)
RDW: 17.5 % — ABNORMAL HIGH (ref 11.5–15.5)
WBC: 8 K/uL (ref 4.0–10.5)
nRBC: 0 % (ref 0.0–0.2)

## 2019-04-05 LAB — ABO/RH: ABO/RH(D): A POS

## 2019-04-05 LAB — TYPE AND SCREEN
ABO/RH(D): A POS
Antibody Screen: NEGATIVE

## 2019-04-05 LAB — RESPIRATORY PANEL BY RT PCR (FLU A&B, COVID)
Influenza A by PCR: NEGATIVE
Influenza B by PCR: NEGATIVE
SARS Coronavirus 2 by RT PCR: NEGATIVE

## 2019-04-05 MED ORDER — MORPHINE SULFATE (PF) 4 MG/ML IV SOLN
4.0000 mg | Freq: Once | INTRAVENOUS | Status: AC
  Administered 2019-04-05: 10:00:00 4 mg via INTRAMUSCULAR
  Filled 2019-04-05: qty 1

## 2019-04-05 MED ORDER — DIPHENHYDRAMINE HCL 50 MG/ML IJ SOLN: 12.5000 mg | INTRAMUSCULAR | Status: DC | PRN

## 2019-04-05 MED ORDER — DIBUCAINE (PERIANAL) 1 % EX OINT: 1.0000 "application " | TOPICAL_OINTMENT | CUTANEOUS | Status: DC | PRN

## 2019-04-05 MED ORDER — OXYCODONE-ACETAMINOPHEN 5-325 MG PO TABS: 1.0000 | ORAL_TABLET | ORAL | Status: DC | PRN

## 2019-04-05 MED ORDER — FENTANYL-BUPIVACAINE-NACL 0.5-0.125-0.9 MG/250ML-% EP SOLN
12.0000 mL/h | EPIDURAL | Status: DC | PRN
  Filled 2019-04-05: qty 250

## 2019-04-05 MED ORDER — EPHEDRINE 5 MG/ML INJ: 10.0000 mg | INTRAVENOUS | Status: DC | PRN

## 2019-04-05 MED ORDER — ONDANSETRON HCL 4 MG/2ML IJ SOLN: 4.0000 mg | INTRAMUSCULAR | Status: DC | PRN

## 2019-04-05 MED ORDER — OXYCODONE-ACETAMINOPHEN 5-325 MG PO TABS: 2.0000 | ORAL_TABLET | ORAL | Status: DC | PRN

## 2019-04-05 MED ORDER — IBUPROFEN 600 MG PO TABS
600.0000 mg | ORAL_TABLET | Freq: Four times a day (QID) | ORAL | Status: DC
  Administered 2019-04-06 (×3): 600 mg via ORAL
  Filled 2019-04-05 (×3): qty 1

## 2019-04-05 MED ORDER — OXYTOCIN BOLUS FROM INFUSION
500.0000 mL | Freq: Once | INTRAVENOUS | Status: AC
  Administered 2019-04-05: 18:00:00 500 mL via INTRAVENOUS

## 2019-04-05 MED ORDER — LACTATED RINGERS IV SOLN
500.0000 mL | Freq: Once | INTRAVENOUS | Status: AC
  Administered 2019-04-05: 12:00:00 500 mL via INTRAVENOUS

## 2019-04-05 MED ORDER — WITCH HAZEL-GLYCERIN EX PADS: 1.0000 "application " | MEDICATED_PAD | CUTANEOUS | Status: DC | PRN

## 2019-04-05 MED ORDER — ACETAMINOPHEN 325 MG PO TABS: 650.0000 mg | ORAL_TABLET | ORAL | Status: DC | PRN

## 2019-04-05 MED ORDER — ACETAMINOPHEN 325 MG PO TABS
650.0000 mg | ORAL_TABLET | ORAL | Status: DC | PRN
  Administered 2019-04-06: 02:00:00 650 mg via ORAL
  Filled 2019-04-05: qty 2

## 2019-04-05 MED ORDER — SOD CITRATE-CITRIC ACID 500-334 MG/5ML PO SOLN: 30.0000 mL | ORAL | Status: DC | PRN

## 2019-04-05 MED ORDER — LIDOCAINE HCL (PF) 1 % IJ SOLN: 30.0000 mL | INTRAMUSCULAR | Status: DC | PRN

## 2019-04-05 MED ORDER — OXYTOCIN 40 UNITS IN NORMAL SALINE INFUSION - SIMPLE MED
2.5000 [IU]/h | INTRAVENOUS | Status: DC
  Filled 2019-04-05: qty 1000

## 2019-04-05 MED ORDER — SENNOSIDES-DOCUSATE SODIUM 8.6-50 MG PO TABS
2.0000 | ORAL_TABLET | ORAL | Status: DC
  Administered 2019-04-06: 2 via ORAL
  Filled 2019-04-05: qty 2

## 2019-04-05 MED ORDER — SODIUM CHLORIDE (PF) 0.9 % IJ SOLN
INTRAMUSCULAR | Status: DC | PRN
  Administered 2019-04-05: 12 mL/h via EPIDURAL

## 2019-04-05 MED ORDER — LACTATED RINGERS IV SOLN: 500.0000 mL | INTRAVENOUS | Status: DC | PRN

## 2019-04-05 MED ORDER — PHENYLEPHRINE 40 MCG/ML (10ML) SYRINGE FOR IV PUSH (FOR BLOOD PRESSURE SUPPORT): 80.0000 ug | PREFILLED_SYRINGE | INTRAVENOUS | Status: DC | PRN

## 2019-04-05 MED ORDER — PRENATAL MULTIVITAMIN CH
1.0000 | ORAL_TABLET | Freq: Every day | ORAL | Status: DC
  Filled 2019-04-05: qty 1

## 2019-04-05 MED ORDER — PROMETHAZINE HCL 25 MG/ML IJ SOLN
12.5000 mg | Freq: Once | INTRAMUSCULAR | Status: AC
  Administered 2019-04-05: 10:00:00 12.5 mg via INTRAMUSCULAR
  Filled 2019-04-05: qty 1

## 2019-04-05 MED ORDER — BENZOCAINE-MENTHOL 20-0.5 % EX AERO
1.0000 "application " | INHALATION_SPRAY | CUTANEOUS | Status: DC | PRN
  Administered 2019-04-05: 1 via TOPICAL
  Filled 2019-04-05: qty 56

## 2019-04-05 MED ORDER — SIMETHICONE 80 MG PO CHEW: 80.0000 mg | CHEWABLE_TABLET | ORAL | Status: DC | PRN

## 2019-04-05 MED ORDER — ONDANSETRON HCL 4 MG/2ML IJ SOLN
4.0000 mg | Freq: Four times a day (QID) | INTRAMUSCULAR | Status: DC | PRN
  Administered 2019-04-05: 14:00:00 4 mg via INTRAVENOUS
  Filled 2019-04-05: qty 2

## 2019-04-05 MED ORDER — LACTATED RINGERS IV SOLN
INTRAVENOUS | Status: DC
  Administered 2019-04-05 (×2): 125 mL/h via INTRAVENOUS

## 2019-04-05 MED ORDER — FENTANYL CITRATE (PF) 100 MCG/2ML IJ SOLN
100.0000 ug | INTRAMUSCULAR | Status: DC | PRN
  Administered 2019-04-05: 100 ug via INTRAVENOUS
  Filled 2019-04-05: qty 2

## 2019-04-05 MED ORDER — ONDANSETRON HCL 4 MG PO TABS: 4.0000 mg | ORAL_TABLET | ORAL | Status: DC | PRN

## 2019-04-05 MED ORDER — COCONUT OIL OIL: 1.0000 "application " | TOPICAL_OIL | Status: DC | PRN

## 2019-04-05 MED ORDER — LIDOCAINE HCL (PF) 1 % IJ SOLN
INTRAMUSCULAR | Status: DC | PRN
  Administered 2019-04-05: 11 mL via EPIDURAL

## 2019-04-05 MED ORDER — DIPHENHYDRAMINE HCL 25 MG PO CAPS: 25.0000 mg | ORAL_CAPSULE | Freq: Four times a day (QID) | ORAL | Status: DC | PRN

## 2019-04-05 NOTE — H&P (Addendum)
OBSTETRIC ADMISSION HISTORY AND PHYSICAL  Taylor Poole is a 19 y.o. female G2P1001 with IUP at [redacted]w[redacted]d presenting for active labor. She reports +FMs. No LOF, VB, blurry vision, headaches, peripheral edema, or RUQ pain. She plans on breast feeding. She requests a contraceptive patch for birth control.  Dating: By LMP--->  Estimated Date of Delivery: 04/17/19  Prenatal History/Complications: - thrombocytopenia - marijuana use - short pregnancy interval  Past Medical History: Past Medical History:  Diagnosis Date  . Anemia   . Dehydration 10/09/2016  . Depression   . Medical history non-contributory     Past Surgical History: Past Surgical History:  Procedure Laterality Date  . NO PAST SURGERIES    . WISDOM TOOTH EXTRACTION  2015    Obstetrical History: OB History    Gravida  2   Para  1   Term  1   Preterm      AB      Living  1     SAB      TAB      Ectopic      Multiple      Live Births  1           Social History: Social History   Socioeconomic History  . Marital status: Single    Spouse name: Estate agent  . Number of children: 1  . Years of education: 62  . Highest education level: 11th grade  Occupational History  . Not on file  Tobacco Use  . Smoking status: Never Smoker  . Smokeless tobacco: Never Used  Substance and Sexual Activity  . Alcohol use: No  . Drug use: Not Currently    Types: Marijuana  . Sexual activity: Yes    Birth control/protection: None  Other Topics Concern  . Not on file  Social History Narrative  . Not on file   Social Determinants of Health   Financial Resource Strain: Medium Risk  . Difficulty of Paying Living Expenses: Somewhat hard  Food Insecurity: No Food Insecurity  . Worried About Programme researcher, broadcasting/film/video in the Last Year: Never true  . Ran Out of Food in the Last Year: Never true  Transportation Needs: No Transportation Needs  . Lack of Transportation (Medical): No  . Lack of Transportation  (Non-Medical): No  Physical Activity: Insufficiently Active  . Days of Exercise per Week: 3 days  . Minutes of Exercise per Session: 20 min  Stress: No Stress Concern Present  . Feeling of Stress : Only a little  Social Connections: Somewhat Isolated  . Frequency of Communication with Friends and Family: Once a week  . Frequency of Social Gatherings with Friends and Family: Never  . Attends Religious Services: 1 to 4 times per year  . Active Member of Clubs or Organizations: No  . Attends Banker Meetings: Never  . Marital Status: Living with partner    Family History: Family History  Problem Relation Age of Onset  . Cancer Maternal Grandfather     Allergies: No Known Allergies  No medications prior to admission.     Review of Systems:  All systems reviewed and negative except as stated in HPI  PE: Blood pressure 127/86, pulse 70, temperature (!) 97.5 F (36.4 C), temperature source Oral, resp. rate 16, height 5\' 2"  (1.575 m), weight 72.6 kg, not currently breastfeeding. General appearance: alert, cooperative and no distress Lungs: regular rate and effort Heart: regular rate  Abdomen: soft, non-tender Extremities: Homans sign is negative,  no sign of DVT Presentation: cephalic EFM: 478 bpm, moderate variability, accels, no decels Toco: every 2-4 minutes Dilation: 4 Effacement (%): 80 Station: -2 Exam by:: Ginger Morris RN  Prenatal labs: ABO, Rh: A/Positive/-- (08/03 1447) Antibody: Negative (11/20 0919) Rubella: 9.52 (08/03 1447) RPR: Non Reactive (11/20 0919)  HBsAg: Negative (08/03 1447)  HIV: Non Reactive (11/20 0919)  GBS: --Henderson Cloud (01/11 1222)   Prenatal Transfer Tool  Maternal Diabetes: No Genetic Screening: Normal Maternal Ultrasounds/Referrals: Normal Fetal Ultrasounds or other Referrals:  None Maternal Substance Abuse:  Yes:  Type: Marijuana Significant Maternal Medications:  None Significant Maternal Lab Results: Group B Strep  negative  No results found for this or any previous visit (from the past 24 hour(s)).  Patient Active Problem List   Diagnosis Date Noted  . Indication for care in labor or delivery 04/05/2019  . 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 Supervision of normal pregnancy 10/12/2018  . H/O Gestational thrombocytopenia ([place]) 02/12/2018    Assessment: [name] is a 19 y.o. G2P1001 at [redacted]w[redacted]d here for labor  1. Labor: early active 2. FWB: Reassuring, category 1 3. Pain: Epidural 4. GBS: Negative   Plan: Admit to labor and delivery  -May have fentanyl PRN -May have epidural.   Darlin Coco, Student-MidWife  04/05/2019, 11:34 AM  Midwife attestation: I have seen and examined this patient; I agree with above documentation in the student's note.   PE: Gen: calm comfortable, NAD Resp: normal effort and rate Abd: gravid  ROS, labs, PMH reviewed  Assessment/Plan: Taylor Poole is a 19 y.o. G2P1001 here for labor Admit to LD Labor: early active FWB: Cat I GBS neg Anticipate labor progress and SVD  Julianne Handler, CNM  04/05/2019, 2:12 PM

## 2019-04-05 NOTE — ED Provider Notes (Signed)
AP-EMERGENCY DEPT [facility] Emergency Department Provider Note MRN:  [id number]  Arrival date & time: 04/05/19     Chief Complaint   Contractions   History of Present Illness   Taylor Poole is a 19 y.o. year-old female with a history of depression presenting to the ED with chief complaint of contractions.  Patient is [redacted] weeks pregnant, this is her second child.  She explains that she began having contractions at 8 PM.  Initially they were over 20 minutes apart.  Over the past hour they become between 5 and 10 minutes apart.  Denies leakage of fluid, no vaginal bleeding, no other complaints.  No recent fever or cough.  Contractions are mild to moderate in severity.  Review of Systems  A complete 10 system review of systems was obtained and all systems are negative except as noted in the HPI and PMH.   Patient's Health History    Past Medical History:  Diagnosis Date  . Dehydration 10/09/2016  . Depression   . Medical history non-contributory     Past Surgical History:  Procedure Laterality Date  . NO PAST SURGERIES    . WISDOM TOOTH EXTRACTION  2015    Family History  Problem Relation Age of Onset  . Cancer Maternal Grandfather     Social History   Socioeconomic History  . Marital status: Single    Spouse name: Estate agent  . Number of children: 1  . Years of education: 18  . Highest education level: 11th grade  Occupational History  . Not on file  Tobacco Use  . Smoking status: Never Smoker  . Smokeless tobacco: Never Used  Substance and Sexual Activity  . Alcohol use: No  . Drug use: Not Currently    Types: Marijuana  . Sexual activity: Yes    Birth control/protection: None  Other Topics Concern  . Not on file  Social History Narrative  . Not on file   Social Determinants of Health   Financial Resource Strain: Medium Risk  . Difficulty of Paying Living Expenses: Somewhat hard  Food Insecurity: No Food Insecurity  . Worried About Brewing technologist in the Last Year: Never true  . Ran Out of Food in the Last Year: Never true  Transportation Needs: No Transportation Needs  . Lack of Transportation (Medical): No  . Lack of Transportation (Non-Medical): No  Physical Activity: Insufficiently Active  . Days of Exercise per Week: 3 days  . Minutes of Exercise per Session: 20 min  Stress: No Stress Concern Present  . Feeling of Stress : Only a little  Social Connections: Somewhat Isolated  . Frequency of Communication with Friends and Family: Once a week  . Frequency of Social Gatherings with Friends and Family: Never  . Attends Religious Services: 1 to 4 times per year  . Active Member of Clubs or Organizations: No  . Attends Banker Meetings: Never  . Marital Status: Living with partner  Intimate Partner Violence: Not At Risk  . Fear of Current or Ex-Partner: No  . Emotionally Abused: No  . Physically Abused: No  . Sexually Abused: No     Physical Exam   Vitals:   04/05/19 0345  BP: 125/86  Pulse: 83  Resp: 16  Temp: 98.7 F (37.1 C)  SpO2: 100%    CONSTITUTIONAL: Well-appearing, NAD NEURO:  Alert and oriented x 3, no focal deficits EYES:  eyes equal and reactive ENT/NECK:  no LAD, no JVD CARDIO: Regular  rate, well-perfused, normal S1 and S2 PULM:  CTAB no wheezing or rhonchi GI/GU: Gravid, nontender MSK/SPINE:  No gross deformities, no edema SKIN:  no rash, atraumatic PSYCH:  Appropriate speech and behavior  *Additional and/or pertinent findings included in MDM below  Diagnostic and Interventional Summary    EKG Interpretation  Date/Time:    Ventricular Rate:    PR Interval:    QRS Duration:   QT Interval:    QTC Calculation:   R Axis:     Text Interpretation:        Cardiac Monitoring Interpretation:  Labs Reviewed - No data to display  No orders to display    Medications - No data to display   Procedures  /  Critical Care Procedures  ED Course and Medical Decision Making    I have reviewed the triage vital signs, the nursing notes, and pertinent available records from the EMR.  Pertinent labs & imaging results that were available during my care of the patient were reviewed by me and considered in my medical decision making (see below for details).     Reassuring vital signs for the patient, reassuring fetal monitoring with fetal heart rates in the 130s.  Exam reveals only 1 or 2 cm of dilation, not yet fully effaced.  Discussed these findings and the frequency of contractions with Dr. Excell Seltzer of OB/GYN.  Explains the patient may be on the "on-ramp" to delivery but not quite in labor.  At this stage, patient does not require observation, can go home and continue to monitor her symptoms.  Advised to go to the [facility] women's center when her contractions are less than 5 minutes apart for a duration of 2 hours and are increasing in severity.    Barth Kirks. Sedonia Small, [place] [email or web address]  Final Clinical Impressions(s) / ED Diagnoses     ICD-10-CM   1. Third trimester pregnancy  Z34.93   2. Uterine contractions during pregnancy  O62.2     ED Discharge Orders    None       Discharge Instructions Discussed with and Provided to Patient:     Discharge Instructions     You were evaluated in the Emergency Department and after careful evaluation, we did not find any emergent condition requiring admission or further testing in the hospital.  Your exam/testing today is overall reassuring.  As discussed, you do not appear to be in labor just yet.  Please continue to monitor your contractions.  When your contractions become less than 5 minutes apart for 2 hours in a row and to become more intense, it will be time to go to the women's center for delivery.  This is located in [place].  Please return to the Emergency Department if you experience any worsening of your condition.  We encourage you to follow up  with a primary care provider.  Thank you for allowing [place] to be a part of your care.      Maudie Flakes, MD 04/05/19 [phone]

## 2019-04-05 NOTE — Progress Notes (Addendum)
Taylor Poole is a 19 y.o. G2P1001 at [redacted]w[redacted]d by LMP admitted for active labor  Subjective: Doing well, no complaints  Objective: BP (!) 109/52   Pulse 69   Temp (!) 97.5 F (36.4 C) (Oral)   Resp 16   Ht 5\' 2"  (1.575 m)   Wt 72.6 kg   SpO2 99%   BMI 29.26 kg/m  No intake/output data recorded. No intake/output data recorded.  FHT:  FHR: 120 bpm, variability: moderate,  accelerations:  Present,  decelerations:  Present Variable decelerations UC:   regular, every 2-3 minutes SVE:   Dilation: 3.5 Effacement (%): 70 Station: -3 Exam by:: [email or web address], RN  Labs: Lab Results  Component Value Date   WBC 8.0 04/05/2019   HGB 9.9 (L) 04/05/2019   HCT 32.1 (L) 04/05/2019   MCV 77.3 (L) 04/05/2019   PLT 116 (L) 04/05/2019    Assessment / Plan: Spontaneous labor, progressing normally   -Continue expectant management.    Labor: Progressing normally Preeclampsia:   Denies signs and symptoms Fetal Wellbeing:  Category II Pain Control:  Epidural I/D:  n/a Anticipated MOD:  NSVD  04/07/2019, SNM 04/05/2019, 4:25 PM  Midwife attestation I agree with the documentation in the student's note.   04/07/2019, CNM 6:47 PM

## 2019-04-05 NOTE — Plan of Care (Signed)
  Problem: Nutrition: Goal: Adequate nutrition will be maintained Outcome: Completed/Met   Problem: Life Cycle: Goal: Ability to make normal progression through stages of labor will improve Outcome: Completed/Met Goal: Ability to effectively push during vaginal delivery will improve Outcome: Completed/Met   Problem: Role Relationship: Goal: Will demonstrate positive interactions with the child Outcome: Completed/Met   Problem: Safety: Goal: Risk of complications during labor and delivery will decrease Outcome: Completed/Met   Problem: Pain Management: Goal: Relief or control of pain from uterine contractions will improve Outcome: Completed/Met   Problem: Education: Goal: Knowledge of General Education information will improve Description: Including pain rating scale, medication(s)/side effects and non-pharmacologic comfort measures Outcome: Completed/Met   Problem: Health Behavior/Discharge Planning: Goal: Ability to manage health-related needs will improve Outcome: Completed/Met   Problem: Clinical Measurements: Goal: Ability to maintain clinical measurements within normal limits will improve Outcome: Completed/Met Goal: Will remain free from infection Outcome: Completed/Met Goal: Diagnostic test results will improve Outcome: Completed/Met Goal: Respiratory complications will improve Outcome: Completed/Met Goal: Cardiovascular complication will be avoided Outcome: Completed/Met   Problem: Activity: Goal: Risk for activity intolerance will decrease Outcome: Completed/Met   Problem: Nutrition: Goal: Adequate nutrition will be maintained Outcome: Completed/Met   Problem: Coping: Goal: Level of anxiety will decrease Outcome: Completed/Met   Problem: Elimination: Goal: Will not experience complications related to bowel motility Outcome: Completed/Met Goal: Will not experience complications related to urinary retention Outcome: Completed/Met   Problem: Pain  Managment: Goal: General experience of comfort will improve Outcome: Completed/Met   Problem: Safety: Goal: Ability to remain free from injury will improve Outcome: Completed/Met   Problem: Skin Integrity: Goal: Risk for impaired skin integrity will decrease Outcome: Completed/Met

## 2019-04-05 NOTE — Progress Notes (Signed)
Taylor Poole is a 19 y.o. G2P1001 at [redacted]w[redacted]d by LMP admitted for active labor  Subjective: Doing well, more comfortable post epidural.  Will try to take a nap  Objective: BP 128/79   Pulse 61   Temp (!) 97.5 F (36.4 C) (Oral)   Resp 16   Ht 5\' 2"  (1.575 m)   Wt 72.6 kg   SpO2 99%   BMI 29.26 kg/m  No intake/output data recorded. No intake/output data recorded.  FHT:  FHR: 120 bpm, variability: moderate,  accelerations:  Present,  decelerations:  Absent UC:   regular, every 2-4 minutes SVE:   Dilation: 4 Effacement (%): 80 Station: -2 Exam by:: Ginger Morris RN  Labs: Lab Results  Component Value Date   WBC 8.0 04/05/2019   HGB 9.9 (L) 04/05/2019   HCT 32.1 (L) 04/05/2019   MCV 77.3 (L) 04/05/2019   PLT 116 (L) 04/05/2019    Assessment / Plan: Admitted to labor and delivery for active labor  -Reassess cervix in 2 hours.  Labor: Latent Preeclampsia:  Denies signs and symptoms Fetal Wellbeing:  Category I Pain Control:  Epidural I/D:  n/a Anticipated MOD:  NSVD  04/07/2019, SNM 04/05/2019, 1:33 PM

## 2019-04-05 NOTE — Anesthesia Procedure Notes (Signed)
Epidural Patient location during procedure: OB Start time: 04/05/2019 12:45 PM End time: 04/05/2019 12:55 PM  Staffing Anesthesiologist: Lowella Curb, MD Performed: anesthesiologist   Preanesthetic Checklist Completed: patient identified, IV checked, site marked, risks and benefits discussed, surgical consent, monitors and equipment checked, pre-op evaluation and timeout performed  Epidural Patient position: sitting Prep: ChloraPrep Patient monitoring: heart rate, cardiac monitor, continuous pulse ox and blood pressure Approach: midline Location: L2-L3 Injection technique: LOR saline  Needle:  Needle type: Tuohy  Needle gauge: 17 G Needle length: 9 cm Needle insertion depth: 6 cm Catheter type: closed end flexible Catheter size: 20 Guage Catheter at skin depth: 10 cm Test dose: negative  Assessment Events: blood not aspirated, injection not painful, no injection resistance, no paresthesia and negative IV test  Additional Notes Procedure performed by SRNA under direct supervisionReason for block:procedure for pain

## 2019-04-05 NOTE — Anesthesia Preprocedure Evaluation (Signed)
Anesthesia Evaluation  Patient identified by MRN, date of birth, ID band Patient awake    Reviewed: Allergy & Precautions, Patient's Chart, lab work & pertinent test results  History of Anesthesia Complications Negative for: history of anesthetic complications  Airway Mallampati: II  TM Distance: >3 FB Neck ROM: Full    Dental  (+) Teeth Intact, Dental Advisory Given Tongue piercing:   Pulmonary neg pulmonary ROS,    Pulmonary exam normal breath sounds clear to auscultation       Cardiovascular negative cardio ROS Normal cardiovascular exam Rhythm:Regular Rate:Normal     Neuro/Psych negative neurological ROS     GI/Hepatic negative GI ROS, Neg liver ROS,   Endo/Other  negative endocrine ROS  Renal/GU negative Renal ROS     Musculoskeletal negative musculoskeletal ROS (+)   Abdominal   Peds  Hematology negative hematology ROS (+)   Anesthesia Other Findings Day of surgery medications reviewed with the patient.  Reproductive/Obstetrics (+) Pregnancy                             Anesthesia Physical  Anesthesia Plan  ASA: II  Anesthesia Plan: Epidural   Post-op Pain Management:    Induction:   PONV Risk Score and Plan: Treatment may vary due to age or medical condition  Airway Management Planned: Natural Airway  Additional Equipment:   Intra-op Plan:   Post-operative Plan:   Informed Consent: I have reviewed the patients History and Physical, chart, labs and discussed the procedure including the risks, benefits and alternatives for the proposed anesthesia with the patient or authorized representative who has indicated his/her understanding and acceptance.       Plan Discussed with: CRNA  Anesthesia Plan Comments:         Anesthesia Quick Evaluation

## 2019-04-05 NOTE — Discharge Instructions (Addendum)
You were evaluated in the Emergency Department and after careful evaluation, we did not find any emergent condition requiring admission or further testing in the hospital.  Your exam/testing today is overall reassuring.  As discussed, you do not appear to be in labor just yet.  Please continue to monitor your contractions.  When your contractions become less than 5 minutes apart for 2 hours in a row and to become more intense, it will be time to go to the women's center for delivery.  This is located in [place].  Please return to the Emergency Department if you experience any worsening of your condition.  We encourage you to follow up with a primary care provider.  Thank you for allowing [place] to be a part of your care.

## 2019-04-05 NOTE — ED Triage Notes (Addendum)
Pt c/o intermittent contractions that started yesterday afternoon, denies any vaginal discharge, pt reports that she is [redacted] weeks pregnant with her second child,

## 2019-04-05 NOTE — Progress Notes (Signed)
When getting pt up to ambulate for the 1st time, she stated that "she had a pain near where her epidural site was". RN asked if it was a sharp or radiating pain, pt declined. RN assessed site (clean, dry, intact). Pt then stated "it felt  Sore, more like bruise tenderness". RN instructed pt to notify her if the sensation changed or if pain increased. RN will continue to monitor.   Herbert Moors, RN

## 2019-04-05 NOTE — Progress Notes (Deleted)
OBSTETRIC ADMISSION HISTORY AND PHYSICAL  Taylor Poole is a 18 y.o. female G2P1001 with IUP at [redacted]w[redacted]d presenting for active labor. She reports +FMs. No LOF, VB, blurry vision, headaches, peripheral edema, or RUQ pain. She plans on breast feeding. She requests a contraceptive patch for birth control.  Dating: By LMP--->  Estimated Date of Delivery: 04/17/19  Prenatal History/Complications: Low risk pregnancy  Past Medical History: Past Medical History:  Diagnosis Date  . Anemia   . Dehydration 10/09/2016  . Depression   . Medical history non-contributory     Past Surgical History: Past Surgical History:  Procedure Laterality Date  . NO PAST SURGERIES    . WISDOM TOOTH EXTRACTION  2015    Obstetrical History: OB History    Gravida  2   Para  1   Term  1   Preterm      AB      Living  1     SAB      TAB      Ectopic      Multiple      Live Births  1           Social History: Social History   Socioeconomic History  . Marital status: Single    Spouse name: tyre johnson  . Number of children: 1  . Years of education: 11  . Highest education level: 11th grade  Occupational History  . Not on file  Tobacco Use  . Smoking status: Never Smoker  . Smokeless tobacco: Never Used  Substance and Sexual Activity  . Alcohol use: No  . Drug use: Not Currently    Types: Marijuana  . Sexual activity: Yes    Birth control/protection: None  Other Topics Concern  . Not on file  Social History Narrative  . Not on file   Social Determinants of Health   Financial Resource Strain: Medium Risk  . Difficulty of Paying Living Expenses: Somewhat hard  Food Insecurity: No Food Insecurity  . Worried About Running Out of Food in the Last Year: Never true  . Ran Out of Food in the Last Year: Never true  Transportation Needs: No Transportation Needs  . Lack of Transportation (Medical): No  . Lack of Transportation (Non-Medical): No  Physical Activity: Insufficiently  Active  . Days of Exercise per Week: 3 days  . Minutes of Exercise per Session: 20 min  Stress: No Stress Concern Present  . Feeling of Stress : Only a little  Social Connections: Somewhat Isolated  . Frequency of Communication with Friends and Family: Once a week  . Frequency of Social Gatherings with Friends and Family: Never  . Attends Religious Services: 1 to 4 times per year  . Active Member of Clubs or Organizations: No  . Attends Club or Organization Meetings: Never  . Marital Status: Living with partner    Family History: Family History  Problem Relation Age of Onset  . Cancer Maternal Grandfather     Allergies: No Known Allergies  No medications prior to admission.     Review of Systems:  All systems reviewed and negative except as stated in HPI  PE: Blood pressure 127/86, pulse 70, temperature (!) 97.5 F (36.4 C), temperature source Oral, resp. rate 16, height 5' 2" (1.575 m), weight 72.6 kg, not currently breastfeeding. General appearance: alert, cooperative and no distress Lungs: regular rate and effort Heart: regular rate  Abdomen: soft, non-tender Extremities: Homans sign is negative, no sign of DVT Presentation: cephalic   EFM: 120 bpm, moderate variability, accels, no decels Toco: every 2-4 minutes Dilation: 4 Effacement (%): 80 Station: -2 Exam by:: Ginger Morris RN  Prenatal labs: ABO, Rh: A/Positive/-- (08/03 1447) Antibody: Negative (11/20 0919) Rubella: 9.52 (08/03 1447) RPR: Non Reactive (11/20 0919)  HBsAg: Negative (08/03 1447)  HIV: Non Reactive (11/20 0919)  GBS: --Theda Sers (01/11 1222)   Prenatal Transfer Tool  Maternal Diabetes: No Genetic Screening: Normal Maternal Ultrasounds/Referrals: Normal Fetal Ultrasounds or other Referrals:  None Maternal Substance Abuse:  Yes:  Type: Marijuana Significant Maternal Medications:  None Significant Maternal Lab Results: Group B Strep negative  No results found for this or any previous  visit (from the past 24 hour(s)).  Patient Active Problem List   Diagnosis Date Noted  . Indication for care in labor or delivery 04/05/2019  . 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 Supervision of normal pregnancy 10/12/2018  . H/O Gestational thrombocytopenia (HCC) 02/12/2018    Assessment: [name] is a 19 y.o. G2P1001 at [redacted]w[redacted]d here for labor  1. Labor: latent 2. FWB: Reassuring, category 1 3. Pain: Epidural 4. GBS: Negative   Plan: Admit to labor and delivery  -May have fentanyl PRN -May have epidural. -SVE in 2 hours to determine if should consider augmentation.  Felipa Eth, Student-MidWife  04/05/2019, 11:34 AM

## 2019-04-05 NOTE — MAU Note (Signed)
Pt presents via EMS with complaints of contractions. States she went to [facility] early this morning for contractions and sent home. Denies any VB or LOF

## 2019-04-05 NOTE — Progress Notes (Addendum)
OB RR RN called regarding G2P1 pt at [redacted]w[redacted]d presenting with contractions starting about 2000 last night.  Denies bleeding and leaking of fluid.  Currently admitting patient to [facility], will notify attending MD.  Addendum [phone] - called Dr. Despina Hidden, he has already spoken to EDP, patient is cleared to go home on labor precautions.  Called primary RN back to update and assist with any other questions.  Primary RN will reiterate to patient to come to MAU if she thinks she is in labor.

## 2019-04-06 LAB — RPR: RPR Ser Ql: NONREACTIVE

## 2019-04-06 MED ORDER — IBUPROFEN 600 MG PO TABS: 600.0000 mg | ORAL_TABLET | Freq: Four times a day (QID) | ORAL | 0 refills | Status: DC

## 2019-04-06 MED ORDER — HYDROCODONE-ACETAMINOPHEN 10-325 MG PO TABS: 1.0000 | ORAL_TABLET | Freq: Four times a day (QID) | ORAL | Status: DC | PRN

## 2019-04-06 NOTE — Progress Notes (Signed)
Post Partum Day 1 Subjective:  C/O pain at epidural site and cramps not relieved by Motrin, up ad lib, voiding and tolerating PO, small lochia, plans to bottle feed, Ortho-[facility]ra  Objective: Blood pressure 117/70, pulse 62, temperature 98.3 F (36.8 C), temperature source Oral, resp. rate 18, height 5\' 2"  (1.575 m), weight 72.6 kg, SpO2 100 %, unknown if currently breastfeeding.  Physical Exam:  General: alert, cooperative and no distress Lochia:normal flow Chest: CTAB Heart: RRR no m/r/g Abdomen: +BS, soft, nontender,  Uterine Fundus: firm DVT Evaluation: No evidence of DVT seen on physical exam. Extremities: trace edema  Recent Labs    04/05/19 1126  HGB 9.9*  HCT 32.1*    Assessment/Plan: Plan for discharge tomorrow  Norco prn   LOS: 1 day   04/07/19 04/06/2019, 7:29 AM

## 2019-04-06 NOTE — Discharge Summary (Addendum)
Postpartum Discharge Summary    Patient Name: Taylor Poole DOB: [birth year] MRN: [id number]  Date of admission: 04/05/2019 Delivering Provider: Julianne Handler   Date of discharge: 04/06/2019  Admitting diagnosis: Indication for care in labor or delivery [O75.9] Intrauterine pregnancy: [redacted]w[redacted]d    Secondary diagnosis:  Active Problems:   Indication for care in labor or delivery   SVD (spontaneous vaginal delivery)  Additional problems: none     Discharge diagnosis: Term Pregnancy Delivered                                                                                                Post partum procedures: Periurethral lac repair  Augmentation:  None  Complications: None  Hospital course:  Onset of Labor With Vaginal Delivery     19y.o. yo [id number] admitted in Active Labor on 04/05/2019. Patient had an uncomplicated labor course as follows:  Membrane Rupture Time/Date: 2:15 PM ,04/05/2019   Intrapartum Procedures: Episiotomy: None [1]                                         Lacerations:  1st degree [2];Periurethral [8]  Patient had a delivery of a Viable infant. 04/05/2019  Information for the patient's newborn:  WaBrisha, Mccabe0[[id number]]Delivery Method: Vaginal, Spontaneous(Filed from Delivery Summary)     Pateint had an uncomplicated postpartum course.  She is ambulating, tolerating a regular diet, passing flatus, and urinating well. Patient is discharged home in stable condition on 04/06/19.  Delivery time: 6:08 PM    Magnesium Sulfate received: No BMZ received: No Rhophylac:No MMR:No Transfusion:No  Physical exam  Vitals:   04/05/19 2135 04/06/19 0129 04/06/19 0511 04/06/19 0915  BP: 110/64 110/62 117/70 120/86  Pulse: 64 62 62 68  Resp: '18 18 18 18  '$ Temp: 98 F (36.7 C) 97.9 F (36.6 C) 98.3 F (36.8 C) 98.8 F (37.1 C)  TempSrc: Oral Oral Oral Oral  SpO2: 100% 100% 100%   Weight:      Height:       General: alert, cooperative and  no distress Lochia: appropriate, small rubra Uterine Fundus: firm @ U Incision: Laceration approximiated  DVT Evaluation: No evidence of DVT seen on physical exam. Negative Homan's sign. No cords or calf tenderness. No significant calf/ankle edema. Labs: Lab Results  Component Value Date   WBC 8.0 04/05/2019   HGB 9.9 (L) 04/05/2019   HCT 32.1 (L) 04/05/2019   MCV 77.3 (L) 04/05/2019   PLT 116 (L) 04/05/2019   CMP Latest Ref Rng & Units 08/29/2018  Glucose 70 - 99 mg/dL 108(H)  BUN 6 - 20 mg/dL 10  Creatinine 0.44 - 1.00 mg/dL 0.72  Sodium 135 - 145 mmol/L 133(L)  Potassium 3.5 - 5.1 mmol/L 3.1(L)  Chloride 98 - 111 mmol/L 104  CO2 22 - 32 mmol/L 19(L)  Calcium 8.9 - 10.3 mg/dL 9.3  Total Protein 6.5 - 8.1 g/dL -  Total Bilirubin 0.3 - 1.2 mg/dL -  Alkaline Phos 38 - 126 U/L -  AST 15 - 41 U/L -  ALT 0 - 44 U/L -    Discharge instruction: per After Visit Summary and "Baby and Me Booklet".  After visit meds:  Allergies as of 04/06/2019   No Known Allergies      Medication List     TAKE these medications    ibuprofen 600 MG tablet Commonly known as: ADVIL Take 1 tablet (600 mg total) by mouth every 6 (six) hours.       Diet: routine diet  Activity: Advance as tolerated. Pelvic rest for 6 weeks.   Outpatient follow up:6 weeks Follow up Appt: Future Appointments  Date Time Provider [place]  05/11/2019  2:10 PM Roma Schanz, CNM [facility]   Follow up Visit: Follow-up Information     [facility]. Schedule an appointment as soon as possible for a visit in 4 week(s).   Specialty: Obstetrics and Gynecology Contact information: [address] [place] [phone]          Please schedule this patient for Postpartum visit in: 6 weeks with the following provider: Any provider In-Person For C/S patients schedule nurse incision check in weeks 2 weeks: no Low risk pregnancy complicated by:   none Delivery mode:  SVD Anticipated Birth Control:   CHC patch PP Procedures needed:  None   Schedule Integrated BH visit: no  Newborn Data: Live born female  Birth Weight: 6 lb 4.4 oz (2846 g) APGAR: 9, 9  Newborn Delivery   Birth date/time: 04/05/2019 18:08:00 Delivery type: Vaginal, Spontaneous      Baby Feeding: Bottle Disposition:NICU- juandice and sepsis workup   04/06/2019 Darlin Coco, Student-MidWife  Midwife attestation I have seen and examined this patient and agree with above documentation in the student's note.   Abbe Bula is a 19 y.o. [id number] s/p SVD.  Pain is well controlled. Plan for birth control is  patch . Method of Feeding: bottle  PE:  Gen: well appearing Heart: reg rate Lungs: normal WOB Fundus firm Ext: no pain, no edema  Recent Labs    04/05/19 1126  HGB 9.9*  HCT 32.1*    Assessment S/p SVD PPD # 1  Plan: - discharge home - postpartum care discussed - f/u in office in 6 weeks for postpartum visit   Julianne Handler, CNM 4:03 PM

## 2019-04-06 NOTE — Discharge Instructions (Signed)

## 2019-04-06 NOTE — Anesthesia Postprocedure Evaluation (Signed)
Anesthesia Post Note  Patient: Taylor Poole  Procedure(s) Performed: AN AD HOC LABOR EPIDURAL     Patient location during evaluation: Mother Baby Anesthesia Type: Epidural Level of consciousness: awake and alert Pain management: pain level controlled Vital Signs Assessment: post-procedure vital signs reviewed and stable Respiratory status: spontaneous breathing, nonlabored ventilation and respiratory function stable Cardiovascular status: stable Postop Assessment: no headache, no backache and epidural receding Anesthetic complications: no    Last Vitals:  Vitals:   04/06/19 0129 04/06/19 0511  BP: 110/62 117/70  Pulse: 62 62  Resp: 18 18  Temp: 36.6 C 36.8 C  SpO2: 100% 100%    Last Pain:  Vitals:   04/06/19 0730  TempSrc:   PainSc: Asleep   Pain Goal:                   [employer]

## 2019-04-06 NOTE — Clinical Social Work Maternal (Signed)
CLINICAL SOCIAL WORK MATERNAL/CHILD NOTE  Patient Details  Name: Taylor Poole MRN: [id number] Date of Birth: Jul 30, 2000  Date:  04/06/2019  Clinical Social Worker Initiating Note:  Elijio Miles Date/Time: Initiated:  04/06/19/0934     Child's Name:  Undecided   Biological Parents:  Mother, Father(Elgie Solem and Melodye Ped DOB: 06/12/1994)   Need for Interpreter:  None   Reason for Referral:  Behavioral Health Concerns, Current Substance Use/Substance Use During Pregnancy     Address:  [place] [address]    Phone number:  [phone] (home)     Additional phone number:   Household Members/Support Persons (HM/SP):   Household Member/Support Person 1, Household Member/Support Person 2, Household Member/Support Person 3   HM/SP Name Relationship DOB or Age  HM/SP -1 Tyre Johnson FOB 06/12/1994  HM/SP -2 Ny'Aria Marquis Buggy Daughter 04/03/2018  HM/SP -3   FOB's mom    HM/SP -4        HM/SP -5        HM/SP -6        HM/SP -7        HM/SP -8          Natural Supports (not living in the home):  Parent   Professional Supports: None   Employment: Self-employed   Type of Work:     Education:  9 to 11 years   Homebound arranged:    Museum/gallery curator Resources:  Medicaid   Other Resources:  Physicist, medical  , [place] Considerations Which May Impact Care:    Strengths:  Ability to meet basic needs  , Home prepared for child  , Pediatrician chosen   Psychotropic Medications:         Pediatrician:    [place]  Pediatrician List:   [place]  [place]      Pediatrician Fax Number:    Risk Factors/Current Problems:  Substance Use  , Mental Health Concerns     Cognitive State:  Able to Concentrate  , Alert  , Linear Thinking     Mood/Affect:  Agitated  , Calm  , Comfortable  (MOB calm and  appropriate throughout assessment and only became agitated when substance use and CPS report were addressed.)   CSW Assessment:  CSW received consult for history of depression and THC use during pregnancy.  CSW met with MOB to offer support and complete assessment.    MOB sitting in bed with infant asleep in bassinet and FOB present at bedside, when CSW entered the room. CSW introduced self and explained CSW was there to complete assessment. FOB on the phone throughout initial interaction, CSW explained assessments were normally completed with just MOB and requested that FOB step out or to come back at a later time. MOB appeared hesitant of CSW visit and stated she didn't have to do an assessment with her last baby and asked what the assessment was for. CSW explained reason for visit was due to MOB's mental health history and substance use history. MOB expressed understanding and gave CSW verbal permission to complete assessment with FOB present. FOB stepped out of the room briefly after beginning of assessment to take a phone call. MOB reported she and FOB currently live together with FOB's mother and their 35-year-old daughter. 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   CSW inquired about MOB's mental health history to which MOB acknowledged some depression and anxiety during her pregnancy but stressed to [place] that she is feeling ok now. MOB attributed much of her symptoms to getting farther along in pregnancy and being uncomfortable. MOB reported a history of participating in counseling when she was in middle school but denied any interest in counseling resources or medications at this time. CSW inquired about MOB's history of PPD to which MOB reported that she "maybe had some". CSW provided education regarding the baby blues period vs. perinatal mood disorders. CSW recommended  self-evaluation during the postpartum time period using the New Mom Checklist from Postpartum Progress and encouraged MOB to contact a medical professional if symptoms are noted at any time. MOB did not appear to be displaying any acute mental health symptoms and denied any current SI, HI or DV. MOB reported having good support from her mother and father. MOB confirmed having all essential items for infant once discharged and stated infant would be sleeping in a bassinet once home.   CSW inquired about MOB's substance use history to which MOB openly disclosed use of marijuana to help with her nausea, depression and anxiety. MOB reported daily use with last use being at 35 or 36 weeks. CSW informed MOB of Hospital Drug Policy and explained UDS and CDS were collected. CSW informed MOB of infant's positive UDS for THC and explained CPS report would have to be made. MOB not happy with this information and reported she didn't want CPS to come out to her house. CSW explained what process may look like to which MOB referenced this happened with her last child but that no one came out to her house. CSW explained she was unable to speak for last time but that it would ultimately be up to [place]. MOB continued to be displeased with CPS report being made but denied any further questions or concerns for CSW.   CSW made [facility] CPS report due to infant's positive UDS for THC. No barriers to discharge, at this time, CPS to follow up within 72 hours.    CSW Plan/Description:  No Further Intervention Required/No Barriers to Discharge, Sudden Infant Death Syndrome (SIDS) Education, Perinatal Mood and Anxiety Disorder (PMADs) Education, [place], Child Protective Service Report  , CSW Will Continue to Monitor Umbilical Cord Tissue Drug Screen Results and Make Report if Foye Spurling, LCSW 04/06/2019, 10:51 AM

## 2019-04-06 NOTE — Lactation Note (Signed)
This note was copied from a baby's chart. Lactation Consultation Note  Patient Name: Taylor Poole [id number] Date: 04/06/2019  P2, 11 hour female infant. LC entered room mom was breastfeeding infant. Per mom, this is not her first time breastfeeding. Per mom, she doesn't not need any help with breastfeeding she doesn't want LC services at this time. LC stated she understood and politely left room.     Maternal Data    Feeding Feeding Type: Breast Fed  LATCH Score                   Interventions    Lactation Tools Discussed/Used     Consult Status      [name] 04/06/2019, 6:00 AM

## 2019-04-06 NOTE — Lactation Note (Addendum)
This note was copied from a baby's chart. Lactation Consultation Note  Patient Name: Taylor Poole [id number] Date: 04/06/2019  P2, 8 hour female infant. LC entered the room mom and infant asleep.   Maternal Data    Feeding Feeding Type: Breast Fed  LATCH Score Latch: Grasps breast easily, tongue down, lips flanged, rhythmical sucking.  Audible Swallowing: A few with stimulation  Type of Nipple: Everted at rest and after stimulation  Comfort (Breast/Nipple): Filling, red/small blisters or bruises, mild/mod discomfort  Hold (Positioning): Assistance needed to correctly position infant at breast and maintain latch.  LATCH Score: 7  Interventions Interventions: Breast feeding basics reviewed;Assisted with latch;Skin to skin;Adjust position;Support pillows;Position options  Lactation Tools Discussed/Used     Consult Status      [name] 04/06/2019, 3:04 AM

## 2019-04-06 NOTE — Lactation Note (Addendum)
This note was copied from a baby's chart. Lactation Consultation Note  Patient Name: Taylor Poole [id number] Date: 04/06/2019  P2, 11 hour female infant. LC made 2nd attempt for [facility] services, mom and infant asleep.    Maternal Data    Feeding Feeding Type: Breast Fed  LATCH Score                   Interventions    Lactation Tools Discussed/Used     Consult Status      [name] 04/06/2019, 5:35 AM

## 2019-05-11 ENCOUNTER — Telehealth: Payer: Medicaid Other | Admitting: Women's Health

## 2019-05-14 ENCOUNTER — Emergency Department (HOSPITAL_COMMUNITY): Payer: Medicaid Other

## 2019-05-14 ENCOUNTER — Emergency Department (HOSPITAL_COMMUNITY)
Admission: EM | Admit: 2019-05-14 | Discharge: 2019-05-14 | Disposition: A | Discharge status: Home / Self Care | Payer: Medicaid Other | Attending: Emergency Medicine | Admitting: Emergency Medicine

## 2019-05-14 ENCOUNTER — Other Ambulatory Visit: Payer: Self-pay

## 2019-05-14 ENCOUNTER — Encounter (HOSPITAL_COMMUNITY): Payer: Self-pay | Admitting: Emergency Medicine

## 2019-05-14 DIAGNOSIS — N83201 Unspecified ovarian cyst, right side: Secondary | ICD-10-CM | POA: Diagnosis not present

## 2019-05-14 DIAGNOSIS — R102 Pelvic and perineal pain: Secondary | ICD-10-CM | POA: Diagnosis not present

## 2019-05-14 DIAGNOSIS — R52 Pain, unspecified: Secondary | ICD-10-CM

## 2019-05-14 DIAGNOSIS — N83202 Unspecified ovarian cyst, left side: Secondary | ICD-10-CM | POA: Diagnosis not present

## 2019-05-14 LAB — COMPREHENSIVE METABOLIC PANEL
ALT: 18 U/L (ref 0–44)
AST: 18 U/L (ref 15–41)
Albumin: 3.7 g/dL (ref 3.5–5.0)
Alkaline Phosphatase: 65 U/L (ref 38–126)
Anion gap: 6 (ref 5–15)
BUN: 12 mg/dL (ref 6–20)
CO2: 23 mmol/L (ref 22–32)
Calcium: 8.8 mg/dL — ABNORMAL LOW (ref 8.9–10.3)
Chloride: 107 mmol/L (ref 98–111)
Creatinine, Ser: 0.75 mg/dL (ref 0.44–1.00)
GFR calc Af Amer: >60 mL/min (ref >60)
GFR calc non Af Amer: >60 mL/min (ref >60)
Glucose, Bld: 94 mg/dL (ref 70–99)
Potassium: 3.8 mmol/L (ref 3.5–5.1)
Sodium: 136 mmol/L (ref 135–145)
Total Bilirubin: 0.5 mg/dL (ref 0.3–1.2)
Total Protein: 6.4 g/dL — ABNORMAL LOW (ref 6.5–8.1)

## 2019-05-14 LAB — URINALYSIS, ROUTINE W REFLEX MICROSCOPIC
Bilirubin Urine: NEGATIVE
Glucose, UA: NEGATIVE mg/dL
Hgb urine dipstick: NEGATIVE
Ketones, ur: NEGATIVE mg/dL
Leukocytes,Ua: NEGATIVE
Nitrite: NEGATIVE
Protein, ur: NEGATIVE mg/dL
Specific Gravity, Urine: 1.028 (ref 1.005–1.030)
pH: 6 (ref 5.0–8.0)

## 2019-05-14 LAB — CBC WITH DIFFERENTIAL/PLATELET
Abs Immature Granulocytes: 0 10*3/uL (ref 0.00–0.07)
Basophils Absolute: 0 10*3/uL (ref 0.0–0.1)
Basophils Relative: 1 %
Eosinophils Absolute: 0.1 10*3/uL (ref 0.0–0.5)
Eosinophils Relative: 4 %
HCT: 35.1 % — ABNORMAL LOW (ref 36.0–46.0)
Hemoglobin: 10.6 g/dL — ABNORMAL LOW (ref 12.0–15.0)
Immature Granulocytes: 0 %
Lymphocytes Relative: 43 %
Lymphs Abs: 1.7 10*3/uL (ref 0.7–4.0)
MCH: 24.2 pg — ABNORMAL LOW (ref 26.0–34.0)
MCHC: 30.2 g/dL (ref 30.0–36.0)
MCV: 80.1 fL (ref 80.0–100.0)
Monocytes Absolute: 0.3 10*3/uL (ref 0.1–1.0)
Monocytes Relative: 8 %
Neutro Abs: 1.7 10*3/uL (ref 1.7–7.7)
Neutrophils Relative %: 44 %
Platelets: 117 10*3/uL — ABNORMAL LOW (ref 150–400)
RBC: 4.38 MIL/uL (ref 3.87–5.11)
RDW: 21.1 % — ABNORMAL HIGH (ref 11.5–15.5)
WBC: 3.9 10*3/uL — ABNORMAL LOW (ref 4.0–10.5)
nRBC: 0 % (ref 0.0–0.2)

## 2019-05-14 LAB — PREGNANCY, URINE: Preg Test, Ur: NEGATIVE

## 2019-05-14 MED ORDER — METHOCARBAMOL 500 MG PO TABS
500.0000 mg | ORAL_TABLET | Freq: Once | ORAL | Status: AC
  Administered 2019-05-14: 500 mg via ORAL
  Filled 2019-05-14: qty 1

## 2019-05-14 MED ORDER — CYCLOBENZAPRINE HCL 10 MG PO TABS: 10.0000 mg | ORAL_TABLET | Freq: Two times a day (BID) | ORAL | 0 refills | Status: DC | PRN

## 2019-05-14 MED ORDER — IOHEXOL 300 MG/ML  SOLN
100.0000 mL | Freq: Once | INTRAMUSCULAR | Status: AC | PRN
  Administered 2019-05-14: 100 mL via INTRAVENOUS

## 2019-05-14 MED ORDER — FENTANYL CITRATE (PF) 100 MCG/2ML IJ SOLN
50.0000 ug | Freq: Once | INTRAMUSCULAR | Status: AC
  Administered 2019-05-14: 50 ug via INTRAVENOUS
  Filled 2019-05-14: qty 2

## 2019-05-14 MED ORDER — KETOROLAC TROMETHAMINE 60 MG/2ML IM SOLN
60.0000 mg | Freq: Once | INTRAMUSCULAR | Status: AC
  Administered 2019-05-14: 60 mg via INTRAMUSCULAR
  Filled 2019-05-14: qty 2

## 2019-05-14 NOTE — ED Provider Notes (Signed)
[facility] EMERGENCY DEPARTMENT Provider Note   CSN: [id number] Arrival date & time: 05/14/19  0015     History Chief Complaint  Patient presents with  . Groin Pain    Taylor Poole is a 19 y.o. female.  Llq/groin pain after intercourse earlier tonight.    Groin Pain This is a new problem. The current episode started 3 to 5 hours ago. The problem occurs constantly. The problem has not changed since onset.Pertinent negatives include no chest pain, no abdominal pain and no headaches. The symptoms are aggravated by intercourse. Nothing relieves the symptoms. She has tried nothing for the symptoms.       Past Medical History:  Diagnosis Date  . Anemia   . Dehydration 10/09/2016  . Depression   . Medical history non-contributory     Patient Active Problem List   Diagnosis Date Noted  . Indication for care in labor or delivery 04/05/2019  . SVD (spontaneous vaginal delivery) 04/05/2019  . 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 Supervision of normal pregnancy 10/12/2018  . H/O Gestational thrombocytopenia ([place]) 02/12/2018    Past Surgical History:  Procedure Laterality Date  . NO PAST SURGERIES    . WISDOM TOOTH EXTRACTION  2015     OB History    Gravida  2   Para  2   Term  2   Preterm      AB      Living  2     SAB      TAB      Ectopic      Multiple  0   Live Births  2           Family History  Problem Relation Age of Onset  . Cancer Maternal Grandfather     Social History   Tobacco Use  . Smoking status: Never Smoker  . Smokeless tobacco: Never Used  Substance Use Topics  . Alcohol use: No  . Drug use: Not Currently    Types: Marijuana    Home Medications Prior to Admission medications   Medication Sig Start Date End Date Taking? Authorizing Provider  cyclobenzaprine (FLEXERIL) 10 MG tablet Take 1 tablet (10 mg total) by mouth 2 (two) times daily as needed for muscle spasms. 05/14/19   Linette Gunderson, Corene Cornea, MD  ibuprofen (ADVIL) 600 MG tablet  Take 1 tablet (600 mg total) by mouth every 6 (six) hours. 04/06/19   Julianne Handler, CNM    Allergies    Patient has no known allergies.  Review of Systems   Review of Systems  Cardiovascular: Negative for chest pain.  Gastrointestinal: Negative for abdominal pain.  Neurological: Negative for headaches.  All other systems reviewed and are negative.   Physical Exam Updated Vital Signs BP (!) 118/49 (BP Location: Right Arm)   Pulse 64   Temp 98.2 F (36.8 C) (Oral)   Resp 16   Wt 68 kg   SpO2 100%   BMI 27.44 kg/m   Physical Exam Vitals and nursing note reviewed.  Constitutional:      Appearance: She is well-developed.  HENT:     Head: Normocephalic and atraumatic.     Mouth/Throat:     Mouth: Mucous membranes are dry.     Pharynx: Oropharynx is clear.  Eyes:     Conjunctiva/sclera: Conjunctivae normal.  Cardiovascular:     Rate and Rhythm: Normal rate and regular rhythm.  Pulmonary:     Effort: No respiratory distress.     Breath sounds: No  stridor.  Abdominal:     General: There is no distension.     Tenderness: There is abdominal tenderness (left inguinal area).  Musculoskeletal:        General: No swelling or tenderness. Normal range of motion.     Cervical back: Normal range of motion.  Skin:    General: Skin is warm and dry.  Neurological:     General: No focal deficit present.     Mental Status: She is alert.     ED Results / Procedures / Treatments   Labs (all labs ordered are listed, but only abnormal results are displayed) Labs Reviewed  URINALYSIS, ROUTINE W REFLEX MICROSCOPIC - Abnormal; Notable for the following components:      Result Value   APPearance HAZY (*)    All other components within normal limits  CBC WITH DIFFERENTIAL/PLATELET - Abnormal; Notable for the following components:   WBC 3.9 (*)    Hemoglobin 10.6 (*)    HCT 35.1 (*)    MCH 24.2 (*)    RDW 21.1 (*)    Platelets 117 (*)    All other components within normal  limits  COMPREHENSIVE METABOLIC PANEL - Abnormal; Notable for the following components:   Calcium 8.8 (*)    Total Protein 6.4 (*)    All other components within normal limits  PREGNANCY, URINE    EKG None  Radiology CT ABDOMEN PELVIS W CONTRAST  Result Date: 05/14/2019 CLINICAL DATA:  Pelvic pain following recent sexual intercourse, initial encounter EXAM: CT ABDOMEN AND PELVIS WITH CONTRAST TECHNIQUE: Multidetector CT imaging of the abdomen and pelvis was performed using the standard protocol following bolus administration of intravenous contrast. CONTRAST:  OMNIPAQUE IOHEXOL 300 MG/ML  SOLN COMPARISON:  None. FINDINGS: Lower chest: No acute abnormality. Hepatobiliary: No focal liver abnormality is seen. No gallstones, gallbladder wall thickening, or biliary dilatation. Pancreas: Unremarkable. No pancreatic ductal dilatation or surrounding inflammatory changes. Spleen: Normal in size without focal abnormality. Adrenals/Urinary Tract: Adrenal glands are unremarkable. Kidneys are normal, without renal calculi, focal lesion, or hydronephrosis. Bladder is unremarkable. Stomach/Bowel: The appendix is within normal limits. No small or large bowel abnormality is noted. The stomach is decompressed. Vascular/Lymphatic: No significant vascular findings are present. No enlarged abdominal or pelvic lymph nodes. Reproductive: Uterus is prominent consistent with the recent postpartum state. 2.4 cm left ovarian cyst noted. 2.3 cm right ovarian cyst is noted as well. Other: No free fluid is noted. No focal fluid collection to suggest abscess formation is noted. Musculoskeletal: No acute or significant osseous findings. IMPRESSION: Bilateral ovarian cysts.  No other focal abnormality is noted. Electronically Signed   By: Alcide Clever M.D.   On: 05/14/2019 02:39   US PELVIC COMPLETE W TRANSVAGINAL AND TORSION R/O  Result Date: 05/14/2019 CLINICAL DATA:  Pelvic pain for 5 hours EXAM: TRANSABDOMINAL AND  TRANSVAGINAL ULTRASOUND OF PELVIS DOPPLER ULTRASOUND OF OVARIES TECHNIQUE: Both transabdominal and transvaginal ultrasound examinations of the pelvis were performed. Transabdominal technique was performed for global imaging of the pelvis including uterus, ovaries, adnexal regions, and pelvic cul-de-sac. It was necessary to proceed with endovaginal exam following the transabdominal exam to visualize the ovaries. COMPARISON:  None. FINDINGS: Measurements: 10.8 x 6.0 x 7.3 cm = volume: 247 mL. No fibroids or other mass visualized. Endometrium Thickness: 1.8 mm.  No focal abnormality visualized. Right ovary Measurements: 3.5 x 2.2 x 3.9 cm = volume: 16.9 ML. Normal appearance/no adnexal mass. Left ovary Measurements: 2.9 x 3.4 x 2.1 cm =  volume: 11.4 mL. Normal appearance/no adnexal mass. Other findings Trace free fluid seen within the cul-de-sac. Pulsed Doppler evaluation of both ovaries demonstrates limited visualization of vascular flow in the bilateral ovaries. IMPRESSION: Normal appearing uterus. Nonspecific decreased vascular flow in the bilateral ovaries which could be due to limitation in technical examination versus early partial torsion. If clinical concern remains would recommend repeat examination. These results were called by telephone at the time of interpretation on 05/14/2019 at 5:42 am to provider [facility] , who verbally acknowledged these results. Electronically Signed   By: Jonna Clark M.D.   On: 05/14/2019 05:42    Procedures Procedures (including critical care time)  Medications Ordered in ED Medications  ketorolac (TORADOL) injection 60 mg (60 mg Intramuscular Given 05/14/19 0123)  methocarbamol (ROBAXIN) tablet 500 mg (500 mg Oral Given 05/14/19 0120)  fentaNYL (SUBLIMAZE) injection 50 mcg (50 mcg Intravenous Given 05/14/19 0221)  iohexol (OMNIPAQUE) 300 MG/ML solution 100 mL (100 mLs Intravenous Contrast Given 05/14/19 0225)    ED Course  I have reviewed the triage vital signs and the  nursing notes.  Pertinent labs & imaging results that were available during my care of the patient were reviewed by me and considered in my medical decision making (see chart for details).  Clinical Course as of May 13 601  Fri May 14, 2019  0245 No pathology, does show cysts, will [place] for torsion.  CT ABDOMEN PELVIS W CONTRAST [JM]  0520 Slight leukopenia and anemia, not significant.   CBC with Differential(!) [JM]  [id number] unremarkable  Comprehensive metabolic panel(!) [JM]  [id number] Discussed with radiologist. Low flow to both ovaries. Suspects not technically the best exam as the obvious cysts on ct scan are not present. She states no other e/o torsion but if continued pain, may need repeat US.   US PELVIC COMPLETE W TRANSVAGINAL AND TORSION R/O [JM]  0601 Overall pain improved. Still mild ttp but not suprapubic and only to deep palpation (she thinks it is because she has to urinate).    [JM]    Clinical Course User Index [JM] Mickal Meno, Barbara Cower, MD   MDM Rules/Calculators/A&P                      Discussed abnormalities of CT scan and the lack of perfection in the ultrasound.  At this time rather than get an ultrasound repeated she states her pain is better and she prefers to get home.  She states she will build to return and will come back immediately if her pain worsens or she has any other new symptoms.  Final Clinical Impression(s) / ED Diagnoses Final diagnoses:  Pain  Pelvic pain    Rx / DC Orders ED Discharge Orders         Ordered    cyclobenzaprine (FLEXERIL) 10 MG tablet  2 times daily PRN     05/14/19 0553           Azlee Monforte, Barbara Cower, MD 05/14/19 0630

## 2019-05-14 NOTE — ED Notes (Signed)
Pt returned from Ultrasound. Provided warm blanket and V/S updated at this time.

## 2019-05-14 NOTE — ED Notes (Signed)
Pt transported to Ultrasound.  

## 2019-05-14 NOTE — ED Triage Notes (Addendum)
Pt C/O left groin pain after sexual intercourse around 2000 tonight. Denies urinary symptoms. Pt reports the area is tender to the touch. Pt 6 weeks post partum.

## 2019-05-24 ENCOUNTER — Encounter: Payer: Self-pay | Admitting: *Deleted

## 2019-05-24 ENCOUNTER — Encounter: Payer: Medicaid Other | Admitting: Women's Health

## 2019-05-24 ENCOUNTER — Other Ambulatory Visit: Payer: Self-pay

## 2019-05-26 NOTE — Progress Notes (Signed)
Pt no show/not seen This encounter was created in error - please disregard. 

## 2019-07-22 ENCOUNTER — Emergency Department (HOSPITAL_COMMUNITY)
Admission: EM | Admit: 2019-07-22 | Discharge: 2019-07-22 | Disposition: A | Discharge status: Home / Self Care | Payer: Medicaid Other | Attending: Emergency Medicine | Admitting: Emergency Medicine

## 2019-07-22 ENCOUNTER — Emergency Department (HOSPITAL_COMMUNITY)
Admission: EM | Admit: 2019-07-22 | Discharge: 2019-07-22 | Disposition: A | Discharge status: Home / Self Care | Payer: Medicaid Other | Source: Home / Self Care | Attending: Emergency Medicine | Admitting: Emergency Medicine

## 2019-07-22 ENCOUNTER — Emergency Department (HOSPITAL_COMMUNITY): Payer: Medicaid Other

## 2019-07-22 ENCOUNTER — Encounter (HOSPITAL_COMMUNITY): Payer: Self-pay | Admitting: *Deleted

## 2019-07-22 ENCOUNTER — Other Ambulatory Visit: Payer: Self-pay

## 2019-07-22 DIAGNOSIS — Z20822 Contact with and (suspected) exposure to covid-19: Secondary | ICD-10-CM | POA: Diagnosis not present

## 2019-07-22 DIAGNOSIS — K529 Noninfective gastroenteritis and colitis, unspecified: Secondary | ICD-10-CM

## 2019-07-22 DIAGNOSIS — R112 Nausea with vomiting, unspecified: Secondary | ICD-10-CM | POA: Diagnosis not present

## 2019-07-22 DIAGNOSIS — Z5321 Procedure and treatment not carried out due to patient leaving prior to being seen by health care provider: Secondary | ICD-10-CM | POA: Diagnosis not present

## 2019-07-22 DIAGNOSIS — R109 Unspecified abdominal pain: Secondary | ICD-10-CM | POA: Diagnosis not present

## 2019-07-22 DIAGNOSIS — R1110 Vomiting, unspecified: Secondary | ICD-10-CM | POA: Insufficient documentation

## 2019-07-22 LAB — COMPREHENSIVE METABOLIC PANEL
ALT: 22 U/L (ref 0–44)
ALT: 23 U/L (ref 0–44)
AST: 20 U/L (ref 15–41)
AST: 23 U/L (ref 15–41)
Albumin: 4.3 g/dL (ref 3.5–5.0)
Albumin: 4.9 g/dL (ref 3.5–5.0)
Alkaline Phosphatase: 57 U/L (ref 38–126)
Alkaline Phosphatase: 61 U/L (ref 38–126)
Anion gap: 7 (ref 5–15)
Anion gap: 11 (ref 5–15)
BUN: 8 mg/dL (ref 6–20)
BUN: 8 mg/dL (ref 6–20)
CO2: 24 mmol/L (ref 22–32)
CO2: 19 mmol/L — ABNORMAL LOW (ref 22–32)
Calcium: 9.3 mg/dL (ref 8.9–10.3)
Calcium: 9.6 mg/dL (ref 8.9–10.3)
Chloride: 107 mmol/L (ref 98–111)
Chloride: 107 mmol/L (ref 98–111)
Creatinine, Ser: 0.73 mg/dL (ref 0.44–1.00)
Creatinine, Ser: 0.75 mg/dL (ref 0.44–1.00)
GFR calc Af Amer: >60 mL/min (ref >60)
GFR calc Af Amer: >60 mL/min (ref >60)
GFR calc non Af Amer: >60 mL/min (ref >60)
GFR calc non Af Amer: >60 mL/min (ref >60)
Glucose, Bld: 91 mg/dL (ref 70–99)
Glucose, Bld: 173 mg/dL — ABNORMAL HIGH (ref 70–99)
Potassium: 3.7 mmol/L (ref 3.5–5.1)
Potassium: 3.3 mmol/L — ABNORMAL LOW (ref 3.5–5.1)
Sodium: 138 mmol/L (ref 135–145)
Sodium: 137 mmol/L (ref 135–145)
Total Bilirubin: 0.4 mg/dL (ref 0.3–1.2)
Total Bilirubin: 0.7 mg/dL (ref 0.3–1.2)
Total Protein: 7.4 g/dL (ref 6.5–8.1)
Total Protein: 7.8 g/dL (ref 6.5–8.1)

## 2019-07-22 LAB — CBC
HCT: 37.6 % (ref 36.0–46.0)
HCT: 38 % (ref 36.0–46.0)
Hemoglobin: 11.8 g/dL — ABNORMAL LOW (ref 12.0–15.0)
Hemoglobin: 11.9 g/dL — ABNORMAL LOW (ref 12.0–15.0)
MCH: 25.9 pg — ABNORMAL LOW (ref 26.0–34.0)
MCH: 25.6 pg — ABNORMAL LOW (ref 26.0–34.0)
MCHC: 31.4 g/dL (ref 30.0–36.0)
MCHC: 31.3 g/dL (ref 30.0–36.0)
MCV: 82.6 fL (ref 80.0–100.0)
MCV: 81.9 fL (ref 80.0–100.0)
Platelets: 139 10*3/uL — ABNORMAL LOW (ref 150–400)
Platelets: 110 10*3/uL — ABNORMAL LOW (ref 150–400)
RBC: 4.55 MIL/uL (ref 3.87–5.11)
RBC: 4.64 MIL/uL (ref 3.87–5.11)
RDW: 18.4 % — ABNORMAL HIGH (ref 11.5–15.5)
RDW: 18.2 % — ABNORMAL HIGH (ref 11.5–15.5)
WBC: 4.8 10*3/uL (ref 4.0–10.5)
WBC: 7.4 10*3/uL (ref 4.0–10.5)
nRBC: 0 % (ref 0.0–0.2)
nRBC: 0 % (ref 0.0–0.2)

## 2019-07-22 LAB — LIPASE, BLOOD
Lipase: 29 U/L (ref 11–51)
Lipase: 22 U/L (ref 11–51)

## 2019-07-22 LAB — HCG, QUANTITATIVE, PREGNANCY: hCG, Beta Chain, Quant, S: <1 m[IU]/mL (ref <5)

## 2019-07-22 MED ORDER — SODIUM CHLORIDE 0.9% FLUSH: 3.0000 mL | Freq: Once | INTRAVENOUS | Status: DC

## 2019-07-22 MED ORDER — KETOROLAC TROMETHAMINE 30 MG/ML IJ SOLN
30.0000 mg | Freq: Once | INTRAMUSCULAR | Status: AC
  Administered 2019-07-22: 30 mg via INTRAVENOUS
  Filled 2019-07-22: qty 1

## 2019-07-22 MED ORDER — ONDANSETRON HCL 4 MG/2ML IJ SOLN
4.0000 mg | Freq: Once | INTRAMUSCULAR | Status: AC
  Administered 2019-07-22: 4 mg via INTRAVENOUS
  Filled 2019-07-22: qty 2

## 2019-07-22 MED ORDER — SODIUM CHLORIDE 0.9 % IV BOLUS
2000.0000 mL | Freq: Once | INTRAVENOUS | Status: AC
  Administered 2019-07-22: 2000 mL via INTRAVENOUS

## 2019-07-22 MED ORDER — ONDANSETRON 4 MG PO TBDP
4.0000 mg | ORAL_TABLET | Freq: Once | ORAL | Status: AC | PRN
  Administered 2019-07-22: 4 mg via ORAL
  Filled 2019-07-22: qty 1

## 2019-07-22 MED ORDER — PROMETHAZINE HCL 25 MG RE SUPP: 25.0000 mg | Freq: Four times a day (QID) | RECTAL | 0 refills | Status: DC | PRN

## 2019-07-22 MED ORDER — METOCLOPRAMIDE HCL 5 MG/ML IJ SOLN
10.0000 mg | Freq: Once | INTRAMUSCULAR | Status: AC
  Administered 2019-07-22: 10 mg via INTRAVENOUS
  Filled 2019-07-22: qty 2

## 2019-07-22 MED ORDER — PROMETHAZINE HCL 25 MG/ML IJ SOLN
25.0000 mg | Freq: Once | INTRAMUSCULAR | Status: AC
  Administered 2019-07-22: 25 mg via INTRAVENOUS
  Filled 2019-07-22: qty 1

## 2019-07-22 NOTE — ED Triage Notes (Signed)
Pt woke up this am with n/v/d and breaking out in sweats; pt denies any abdominal pain

## 2019-07-22 NOTE — ED Provider Notes (Signed)
[facility] EMERGENCY DEPARTMENT Provider Note   CSN: [id number] Arrival date & time: 07/22/19  1752     History Chief Complaint  Patient presents with  . Abdominal Pain    Taylor Poole is a 19 y.o. female.  Patient with vomiting and diarrhea  The history is provided by the patient. No language interpreter was used.  Abdominal Pain Pain location:  Generalized Pain quality: aching   Pain radiates to:  Does not radiate Pain severity:  Mild Onset quality:  Sudden Timing:  Constant Progression:  Worsening Chronicity:  New Context: not alcohol use   Relieved by:  Nothing Worsened by:  Nothing Ineffective treatments:  None tried Associated symptoms: diarrhea and vomiting   Associated symptoms: no chest pain, no cough, no fatigue and no hematuria        Past Medical History:  Diagnosis Date  . Anemia   . Dehydration 10/09/2016  . Depression   . Medical history non-contributory     Patient Active Problem List   Diagnosis Date Noted  . Indication for care in labor or delivery 04/05/2019  . SVD (spontaneous vaginal delivery) 04/05/2019  . 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 Supervision of normal pregnancy 10/12/2018  . H/O Gestational thrombocytopenia ([place]) 02/12/2018    Past Surgical History:  Procedure Laterality Date  . NO PAST SURGERIES    . WISDOM TOOTH EXTRACTION  2015     OB History    Gravida  2   Para  2   Term  2   Preterm      AB      Living  2     SAB      TAB      Ectopic      Multiple  0   Live Births  2           Family History  Problem Relation Age of Onset  . Cancer Maternal Grandfather     Social History   Tobacco Use  . Smoking status: Never Smoker  . Smokeless tobacco: Never Used  Substance Use Topics  . Alcohol use: No  . Drug use: Not Currently    Types: Marijuana    Home Medications Prior to Admission medications   Medication Sig Start Date End Date Taking? Authorizing Provider  cyclobenzaprine (FLEXERIL)  10 MG tablet Take 1 tablet (10 mg total) by mouth 2 (two) times daily as needed for muscle spasms. 05/14/19   Mesner, Corene Cornea, MD  ibuprofen (ADVIL) 600 MG tablet Take 1 tablet (600 mg total) by mouth every 6 (six) hours. 04/06/19   Julianne Handler, CNM  promethazine (PHENERGAN) 25 MG suppository Place 1 suppository (25 mg total) rectally every 6 (six) hours as needed for nausea or vomiting. 07/22/19   Milton Ferguson, MD    Allergies    Patient has no known allergies.  Review of Systems   Review of Systems  Constitutional: Negative for appetite change and fatigue.  HENT: Negative for congestion, ear discharge and sinus pressure.   Eyes: Negative for discharge.  Respiratory: Negative for cough.   Cardiovascular: Negative for chest pain.  Gastrointestinal: Positive for abdominal pain, diarrhea and vomiting.  Genitourinary: Negative for frequency and hematuria.  Musculoskeletal: Negative for back pain.  Skin: Negative for rash.  Neurological: Negative for seizures and headaches.  Psychiatric/Behavioral: Negative for hallucinations.    Physical Exam Updated Vital Signs BP 116/68   Pulse 81   Temp 97.6 F (36.4 C) (Oral)   Resp (!) 24  Ht 5\' 2"  (1.575 m)   Wt 59 kg   SpO2 100%   BMI 23.79 kg/m   Physical Exam Vitals and nursing note reviewed.  Constitutional:      Appearance: She is well-developed.  HENT:     Head: Normocephalic.     Right Ear: Tympanic membrane normal.     Nose: Nose normal.  Eyes:     General: No scleral icterus.    Conjunctiva/sclera: Conjunctivae normal.  Neck:     Thyroid: No thyromegaly.  Cardiovascular:     Rate and Rhythm: Normal rate and regular rhythm.     Heart sounds: No murmur. No friction rub. No gallop.   Pulmonary:     Breath sounds: No stridor. No wheezing or rales.  Chest:     Chest wall: No tenderness.  Abdominal:     General: There is no distension.     Tenderness: There is abdominal tenderness. There is no rebound.    Musculoskeletal:        General: Normal range of motion.     Cervical back: Neck supple.  Lymphadenopathy:     Cervical: No cervical adenopathy.  Skin:    Findings: No erythema or rash.  Neurological:     Mental Status: She is alert and oriented to person, place, and time.     Motor: No abnormal muscle tone.     Coordination: Coordination normal.  Psychiatric:        Behavior: Behavior normal.     ED Results / Procedures / Treatments   Labs (all labs ordered are listed, but only abnormal results are displayed) Labs Reviewed  COMPREHENSIVE METABOLIC PANEL - Abnormal; Notable for the following components:      Result Value   Potassium 3.3 (*)    CO2 19 (*)    Glucose, Bld 173 (*)    All other components within normal limits  CBC - Abnormal; Notable for the following components:   Hemoglobin 11.9 (*)    MCH 25.6 (*)    RDW 18.2 (*)    Platelets 110 (*)    All other components within normal limits  LIPASE, BLOOD  HCG, QUANTITATIVE, PREGNANCY  URINALYSIS, ROUTINE W REFLEX MICROSCOPIC    EKG None  Radiology DG ABD ACUTE 2+V W 1V CHEST  Result Date: 07/22/2019 CLINICAL DATA:  Abdominal pain with nausea and vomiting EXAM: DG ABDOMEN ACUTE W/ 1V CHEST COMPARISON:  05/14/2019 FINDINGS: Cardiac shadow is within normal limits. Lungs are well aerated bilaterally without focal infiltrate. No acute bony abnormality is noted. Nonobstructive bowel gas pattern is noted. No free air is seen. No acute bony abnormality is noted. No abnormal mass or abnormal calcifications are noted. IMPRESSION: No acute abnormality is seen. Electronically Signed   By: 07/14/2019 M.D.   On: 07/22/2019 22:38    Procedures Procedures (including critical care time)  Medications Ordered in ED Medications  sodium chloride flush (NS) 0.9 % injection 3 mL (3 mLs Intravenous Not Given 07/22/19 2052)  ondansetron (ZOFRAN-ODT) disintegrating tablet 4 mg (4 mg Oral Given 07/22/19 1934)  sodium chloride 0.9 %  bolus 2,000 mL (2,000 mLs Intravenous New Bag/Given 07/22/19 2051)  ondansetron (ZOFRAN) injection 4 mg (4 mg Intravenous Given 07/22/19 2052)  ketorolac (TORADOL) 30 MG/ML injection 30 mg (30 mg Intravenous Given 07/22/19 2052)  promethazine (PHENERGAN) injection 25 mg (25 mg Intravenous Given 07/22/19 2136)  metoCLOPramide (REGLAN) injection 10 mg (10 mg Intravenous Given 07/22/19 2245)    ED Course  I  have reviewed the triage vital signs and the nursing notes.  Pertinent labs & imaging results that were available during my care of the patient were reviewed by me and considered in my medical decision making (see chart for details).    MDM Rules/Calculators/A&P                      Patient with gastroenteritis.  Patient improved with fluids and nausea medicine.  Labs unremarkable along with abdominal series.  Patient will be discharged home with Phenergan and follow-up with her PCP     This patient presents to the ED for concern of vomiting diarrhea, this involves an extensive number of treatment options, and is a complaint that carries with it a high risk of complications and morbidity.  The differential diagnosis includes gastroenteritis appendicitis   Lab Tests:   I Ordered, reviewed, and interpreted labs, which included CBC chemistries which showed mild anemia and mild hypokalemia  Medicines ordered:   I ordered medication Zofran Phenergan Reglan for nausea  Imaging Studies ordered:   I ordered imaging studies which included acute abdominal series and  I independently visualized and interpreted imaging which showed unremarkable  Additional history obtained:   Additional history obtained from significant other  Previous records obtained and reviewed   Consultations Obtained:     Reevaluation:  After the interventions stated above, I reevaluated the patient and found improved  Critical Interventions:  .   Final Clinical Impression(s) / ED Diagnoses Final  diagnoses:  Gastroenteritis    Rx / DC Orders ED Discharge Orders         Ordered    promethazine (PHENERGAN) 25 MG suppository  Every 6 hours PRN     07/22/19 2253           Bethann Berkshire, MD 07/24/19 1051

## 2019-07-22 NOTE — Discharge Instructions (Signed)
Drink plenty of fluids.  Take Tylenol for pain.  Rest at home tomorrow.  Follow-up with your family doctor next week for recheck

## 2019-07-22 NOTE — ED Triage Notes (Signed)
Pt left and returned because she is not getting any better; pt states she can't feel her hands; pt crying and stating the pain has gotten worse since she left

## 2019-07-23 LAB — SARS CORONAVIRUS 2 (TAT 6-24 HRS): SARS Coronavirus 2: NEGATIVE

## 2019-07-24 ENCOUNTER — Other Ambulatory Visit: Payer: Self-pay

## 2019-07-24 ENCOUNTER — Encounter (HOSPITAL_COMMUNITY): Payer: Self-pay | Admitting: Emergency Medicine

## 2019-07-24 ENCOUNTER — Emergency Department (HOSPITAL_COMMUNITY)
Admission: EM | Admit: 2019-07-24 | Discharge: 2019-07-24 | Disposition: A | Discharge status: Home / Self Care | Payer: Medicaid Other | Attending: Emergency Medicine | Admitting: Emergency Medicine

## 2019-07-24 ENCOUNTER — Emergency Department (HOSPITAL_COMMUNITY): Payer: Medicaid Other

## 2019-07-24 DIAGNOSIS — R112 Nausea with vomiting, unspecified: Secondary | ICD-10-CM | POA: Diagnosis not present

## 2019-07-24 DIAGNOSIS — E876 Hypokalemia: Secondary | ICD-10-CM

## 2019-07-24 DIAGNOSIS — Z79899 Other long term (current) drug therapy: Secondary | ICD-10-CM | POA: Diagnosis not present

## 2019-07-24 DIAGNOSIS — R5381 Other malaise: Secondary | ICD-10-CM | POA: Diagnosis not present

## 2019-07-24 DIAGNOSIS — R109 Unspecified abdominal pain: Secondary | ICD-10-CM | POA: Insufficient documentation

## 2019-07-24 DIAGNOSIS — R1110 Vomiting, unspecified: Secondary | ICD-10-CM | POA: Diagnosis not present

## 2019-07-24 DIAGNOSIS — R1111 Vomiting without nausea: Secondary | ICD-10-CM | POA: Diagnosis not present

## 2019-07-24 DIAGNOSIS — F1290 Cannabis use, unspecified, uncomplicated: Secondary | ICD-10-CM | POA: Diagnosis not present

## 2019-07-24 DIAGNOSIS — R110 Nausea: Secondary | ICD-10-CM | POA: Diagnosis not present

## 2019-07-24 DIAGNOSIS — F12188 Cannabis abuse with other cannabis-induced disorder: Secondary | ICD-10-CM | POA: Diagnosis not present

## 2019-07-24 LAB — CBC WITH DIFFERENTIAL/PLATELET
Abs Immature Granulocytes: 0 10*3/uL (ref 0.00–0.07)
Basophils Absolute: 0 10*3/uL (ref 0.0–0.1)
Basophils Relative: 0 %
Eosinophils Absolute: 0 10*3/uL (ref 0.0–0.5)
Eosinophils Relative: 0 %
HCT: 34.8 % — ABNORMAL LOW (ref 36.0–46.0)
Hemoglobin: 11.2 g/dL — ABNORMAL LOW (ref 12.0–15.0)
Immature Granulocytes: 0 %
Lymphocytes Relative: 11 %
Lymphs Abs: 0.6 10*3/uL — ABNORMAL LOW (ref 0.7–4.0)
MCH: 26.2 pg (ref 26.0–34.0)
MCHC: 32.2 g/dL (ref 30.0–36.0)
MCV: 81.5 fL (ref 80.0–100.0)
Monocytes Absolute: 0.3 10*3/uL (ref 0.1–1.0)
Monocytes Relative: 5 %
Neutro Abs: 4.9 10*3/uL (ref 1.7–7.7)
Neutrophils Relative %: 84 %
Platelets: 142 10*3/uL — ABNORMAL LOW (ref 150–400)
RBC: 4.27 MIL/uL (ref 3.87–5.11)
RDW: 17.9 % — ABNORMAL HIGH (ref 11.5–15.5)
WBC: 5.8 10*3/uL (ref 4.0–10.5)
nRBC: 0 % (ref 0.0–0.2)

## 2019-07-24 LAB — URINALYSIS, ROUTINE W REFLEX MICROSCOPIC
Bacteria, UA: NONE SEEN
Bilirubin Urine: NEGATIVE
Glucose, UA: NEGATIVE mg/dL
Hgb urine dipstick: NEGATIVE
Ketones, ur: 80 mg/dL — AB
Leukocytes,Ua: NEGATIVE
Nitrite: NEGATIVE
Protein, ur: 100 mg/dL — AB
Specific Gravity, Urine: 1.031 — ABNORMAL HIGH (ref 1.005–1.030)
pH: 5 (ref 5.0–8.0)

## 2019-07-24 LAB — RAPID URINE DRUG SCREEN, HOSP PERFORMED
Amphetamines: NOT DETECTED
Barbiturates: NOT DETECTED
Benzodiazepines: NOT DETECTED
Cocaine: NOT DETECTED
Opiates: NOT DETECTED
Tetrahydrocannabinol: POSITIVE — AB

## 2019-07-24 LAB — COMPREHENSIVE METABOLIC PANEL
ALT: 23 U/L (ref 0–44)
AST: 22 U/L (ref 15–41)
Albumin: 5 g/dL (ref 3.5–5.0)
Alkaline Phosphatase: 58 U/L (ref 38–126)
Anion gap: 11 (ref 5–15)
BUN: 17 mg/dL (ref 6–20)
CO2: 20 mmol/L — ABNORMAL LOW (ref 22–32)
Calcium: 8.9 mg/dL (ref 8.9–10.3)
Chloride: 106 mmol/L (ref 98–111)
Creatinine, Ser: 0.82 mg/dL (ref 0.44–1.00)
GFR calc Af Amer: >60 mL/min (ref >60)
GFR calc non Af Amer: >60 mL/min (ref >60)
Glucose, Bld: 96 mg/dL (ref 70–99)
Potassium: 2.8 mmol/L — ABNORMAL LOW (ref 3.5–5.1)
Sodium: 137 mmol/L (ref 135–145)
Total Bilirubin: 1 mg/dL (ref 0.3–1.2)
Total Protein: 8 g/dL (ref 6.5–8.1)

## 2019-07-24 LAB — LIPASE, BLOOD: Lipase: 28 U/L (ref 11–51)

## 2019-07-24 MED ORDER — SODIUM CHLORIDE 0.9 % IV SOLN: INTRAVENOUS | Status: DC

## 2019-07-24 MED ORDER — METOCLOPRAMIDE HCL 5 MG/ML IJ SOLN
10.0000 mg | Freq: Once | INTRAMUSCULAR | Status: AC
  Administered 2019-07-24: 10 mg via INTRAVENOUS
  Filled 2019-07-24: qty 2

## 2019-07-24 MED ORDER — LORAZEPAM 2 MG/ML IJ SOLN
1.0000 mg | Freq: Once | INTRAMUSCULAR | Status: AC
  Administered 2019-07-24: 1 mg via INTRAVENOUS
  Filled 2019-07-24: qty 1

## 2019-07-24 MED ORDER — LORAZEPAM 1 MG PO TABS: 1.0000 mg | ORAL_TABLET | Freq: Once | ORAL | Status: DC

## 2019-07-24 MED ORDER — PROMETHAZINE HCL 25 MG/ML IJ SOLN
12.5000 mg | Freq: Once | INTRAMUSCULAR | Status: AC
  Administered 2019-07-24: 12.5 mg via INTRAVENOUS
  Filled 2019-07-24: qty 1

## 2019-07-24 MED ORDER — IOHEXOL 300 MG/ML  SOLN
100.0000 mL | Freq: Once | INTRAMUSCULAR | Status: AC | PRN
  Administered 2019-07-24: 100 mL via INTRAVENOUS

## 2019-07-24 MED ORDER — POTASSIUM CHLORIDE CRYS ER 20 MEQ PO TBCR
20.0000 meq | EXTENDED_RELEASE_TABLET | Freq: Two times a day (BID) | ORAL | 0 refills | Status: AC
Start: 2019-07-24 — End: ?

## 2019-07-24 MED ORDER — SODIUM CHLORIDE 0.9 % IV BOLUS
1000.0000 mL | Freq: Once | INTRAVENOUS | Status: AC
  Administered 2019-07-24: 1000 mL via INTRAVENOUS

## 2019-07-24 MED ORDER — POTASSIUM CHLORIDE CRYS ER 20 MEQ PO TBCR
40.0000 meq | EXTENDED_RELEASE_TABLET | Freq: Once | ORAL | Status: AC
  Administered 2019-07-24: 40 meq via ORAL
  Filled 2019-07-24: qty 2

## 2019-07-24 MED ORDER — PROMETHAZINE HCL 25 MG PO TABS: 25.0000 mg | ORAL_TABLET | Freq: Four times a day (QID) | ORAL | 1 refills | Status: DC | PRN

## 2019-07-24 NOTE — ED Notes (Signed)
Blood drawn via 23 gauge butterfly from L ac, blood samples given to lab.

## 2019-07-24 NOTE — ED Triage Notes (Addendum)
Patient brought in via EMS from home. Airway patent. Alert and oriented. Patient c/o nausea and vomiting x3 days. Patient states some cramping in abd from vomiting but no pain. Denies any diarrhea. Per patient thinks she had fever yesterday. Patient reports "runny" BM yesterday. Patient used phenergan suppository x2 hours ago with no relief. Patient given zofran in route by p[aramedic with no relief.

## 2019-07-24 NOTE — ED Provider Notes (Signed)
[facility] EMERGENCY DEPARTMENT Provider Note   CSN: [id number] Arrival date & time: 07/24/19  1033     History Chief Complaint  Patient presents with  . Emesis    Taylor Poole is a 19 y.o. female.  Nausea andPatient returns with persistent meeting.  Patient was seen 2 days ago.  Had extensive lab work-up including negative pregnancy test.  And negative Covid test.  Labs without significant abnormality.  Patient has Phenergan suppositories but she says they are not helping.  Vomited multiple times since midnight.  Patient brought in by EMS was given Zofran by them without any relief.  Denies vomiting of any blood.  No upper respiratory symptoms.        Past Medical History:  Diagnosis Date  . Anemia   . Dehydration 10/09/2016  . Depression   . Medical history non-contributory     Patient Active Problem List   Diagnosis Date Noted  . Indication for care in labor or delivery 04/05/2019  . SVD (spontaneous vaginal delivery) 04/05/2019  . 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 Supervision of normal pregnancy 10/12/2018  . H/O Gestational thrombocytopenia ([place]) 02/12/2018    Past Surgical History:  Procedure Laterality Date  . NO PAST SURGERIES    . WISDOM TOOTH EXTRACTION  2015     OB History    Gravida  2   Para  2   Term  2   Preterm      AB      Living  2     SAB      TAB      Ectopic      Multiple  0   Live Births  2           Family History  Problem Relation Age of Onset  . Cancer Maternal Grandfather     Social History   Tobacco Use  . Smoking status: Never Smoker  . Smokeless tobacco: Never Used  Substance Use Topics  . Alcohol use: No  . Drug use: Not Currently    Types: Marijuana    Home Medications Prior to Admission medications   Medication Sig Start Date End Date Taking? Authorizing Provider  promethazine (PHENERGAN) 25 MG suppository Place 1 suppository (25 mg total) rectally every 6 (six) hours as needed for nausea or  vomiting. 07/22/19  Yes Milton Ferguson, MD  cyclobenzaprine (FLEXERIL) 10 MG tablet Take 1 tablet (10 mg total) by mouth 2 (two) times daily as needed for muscle spasms. Patient not taking: Reported on 07/24/2019 05/14/19   Mesner, Corene Cornea, MD  ibuprofen (ADVIL) 600 MG tablet Take 1 tablet (600 mg total) by mouth every 6 (six) hours. Patient not taking: Reported on 07/24/2019 04/06/19   Julianne Handler, CNM  potassium chloride SA (KLOR-CON) 20 MEQ tablet Take 1 tablet (20 mEq total) by mouth 2 (two) times daily. 07/24/19   Fredia Sorrow, MD  promethazine (PHENERGAN) 25 MG tablet Take 1 tablet (25 mg total) by mouth every 6 (six) hours as needed. 07/24/19   Fredia Sorrow, MD    Allergies    Patient has no known allergies.  Review of Systems   Review of Systems  Constitutional: Negative for chills and fever.  HENT: Negative for congestion, rhinorrhea and sore throat.   Eyes: Negative for visual disturbance.  Respiratory: Negative for cough and shortness of breath.   Cardiovascular: Negative for chest pain and leg swelling.  Gastrointestinal: Positive for abdominal pain, nausea and vomiting. Negative for diarrhea.  Genitourinary: Negative  for dysuria.  Musculoskeletal: Negative for back pain and neck pain.  Skin: Negative for rash.  Neurological: Negative for dizziness, light-headedness and headaches.  Hematological: Does not bruise/bleed easily.  Psychiatric/Behavioral: Negative for confusion.    Physical Exam Updated Vital Signs BP (!) 140/114 (BP Location: Left Arm)   Pulse 64   Temp 99 F (37.2 C) (Oral)   Resp 16   Ht 1.575 m (5\' 2" )   Wt 54.4 kg   SpO2 98%   BMI 21.95 kg/m   Physical Exam Vitals and nursing note reviewed.  Constitutional:      General: She is not in acute distress.    Appearance: Normal appearance. She is well-developed.  HENT:     Head: Normocephalic and atraumatic.  Eyes:     Extraocular Movements: Extraocular movements intact.      Conjunctiva/sclera: Conjunctivae normal.     Pupils: Pupils are equal, round, and reactive to light.  Cardiovascular:     Rate and Rhythm: Normal rate and regular rhythm.     Heart sounds: No murmur.  Pulmonary:     Effort: Pulmonary effort is normal. No respiratory distress.     Breath sounds: Normal breath sounds.  Abdominal:     Palpations: Abdomen is soft.     Tenderness: There is no abdominal tenderness.  Musculoskeletal:        General: Normal range of motion.     Cervical back: Normal range of motion and neck supple.  Skin:    General: Skin is warm and dry.  Neurological:     General: No focal deficit present.     Mental Status: She is alert and oriented to person, place, and time.     Cranial Nerves: No cranial nerve deficit.     Sensory: No sensory deficit.     Motor: No weakness.     ED Results / Procedures / Treatments   Labs (all labs ordered are listed, but only abnormal results are displayed) Labs Reviewed  COMPREHENSIVE METABOLIC PANEL - Abnormal; Notable for the following components:      Result Value   Potassium 2.8 (*)    CO2 20 (*)    All other components within normal limits  CBC WITH DIFFERENTIAL/PLATELET - Abnormal; Notable for the following components:   Hemoglobin 11.2 (*)    HCT 34.8 (*)    RDW 17.9 (*)    Platelets 142 (*)    Lymphs Abs 0.6 (*)    All other components within normal limits  URINALYSIS, ROUTINE W REFLEX MICROSCOPIC - Abnormal; Notable for the following components:   APPearance CLOUDY (*)    Specific Gravity, Urine 1.031 (*)    Ketones, ur 80 (*)    Protein, ur 100 (*)    All other components within normal limits  RAPID URINE DRUG SCREEN, HOSP PERFORMED - Abnormal; Notable for the following components:   Tetrahydrocannabinol POSITIVE (*)    All other components within normal limits  LIPASE, BLOOD    EKG None  Radiology CT Abdomen Pelvis W Contrast  Result Date: 07/24/2019 CLINICAL DATA:  Nausea and vomiting. EXAM: CT  ABDOMEN AND PELVIS WITH CONTRAST TECHNIQUE: Multidetector CT imaging of the abdomen and pelvis was performed using the standard protocol following bolus administration of intravenous contrast. CONTRAST:  07/26/2019 OMNIPAQUE IOHEXOL 300 MG/ML  SOLN COMPARISON:  05/14/2019 FINDINGS: Lower chest: Clear lung bases.  Heart normal in size. Hepatobiliary: Liver normal in size and overall attenuation. Focal fatty infiltration lies adjacent to the falciform  ligament, stable from the prior CT. No other liver abnormality. Normal gallbladder. No bile duct dilation. Pancreas: Unremarkable. No pancreatic ductal dilatation or surrounding inflammatory changes. Spleen: Normal in size without focal abnormality. Adrenals/Urinary Tract: Adrenal glands are unremarkable. Kidneys are normal, without renal calculi, focal lesion, or hydronephrosis. Bladder is unremarkable. Stomach/Bowel: Stomach is within normal limits. Appendix appears normal. No evidence of bowel wall thickening, distention, or inflammatory changes. Vascular/Lymphatic: No significant vascular findings are present. No enlarged abdominal or pelvic lymph nodes. Reproductive: Uterus and bilateral adnexa are unremarkable. Other: No abdominal wall hernia or abnormality. No abdominopelvic ascites. Musculoskeletal: No acute or significant osseous findings. IMPRESSION: 1. No acute findings within the abdomen or pelvis. 2. Focal fatty infiltration adjacent to the falciform ligament of the liver, stable from the prior CT. No other abnormality. Electronically Signed   By: Amie Portland M.D.   On: 07/24/2019 13:46   DG ABD ACUTE 2+V W 1V CHEST  Result Date: 07/22/2019 CLINICAL DATA:  Abdominal pain with nausea and vomiting EXAM: DG ABDOMEN ACUTE W/ 1V CHEST COMPARISON:  05/14/2019 FINDINGS: Cardiac shadow is within normal limits. Lungs are well aerated bilaterally without focal infiltrate. No acute bony abnormality is noted. Nonobstructive bowel gas pattern is noted. No free air is  seen. No acute bony abnormality is noted. No abnormal mass or abnormal calcifications are noted. IMPRESSION: No acute abnormality is seen. Electronically Signed   By: Alcide Clever M.D.   On: 07/22/2019 22:38    Procedures Procedures (including critical care time)  Medications Ordered in ED Medications  0.9 %  sodium chloride infusion ( Intravenous New Bag/Given 07/24/19 1145)  metoCLOPramide (REGLAN) injection 10 mg (10 mg Intravenous Given 07/24/19 1057)  sodium chloride 0.9 % bolus 1,000 mL (0 mLs Intravenous Stopped 07/24/19 1259)  promethazine (PHENERGAN) injection 12.5 mg (12.5 mg Intravenous Given 07/24/19 1149)  LORazepam (ATIVAN) injection 1 mg (1 mg Intravenous Given 07/24/19 1303)  iohexol (OMNIPAQUE) 300 MG/ML solution 100 mL (100 mLs Intravenous Contrast Given 07/24/19 1318)  potassium chloride SA (KLOR-CON) CR tablet 40 mEq (40 mEq Oral Given 07/24/19 1357)    ED Course  I have reviewed the triage vital signs and the nursing notes.  Pertinent labs & imaging results that were available during my care of the patient were reviewed by me and considered in my medical decision making (see chart for details).    MDM Rules/Calculators/A&P                      Patient's persistent vomiting secondary most likely to cannabis.  Patient responded well here to Reglan and Phenergan.  Was noted to have some mild hypokalemia with potassium of 2.8.  Not in critical area.  Patient was able to keep down 40 mg of potassium orally here.  We will send her home with potassium for the next few days.  As well as oral Phenergan that she can use if she does not have to use her Phenergan suppositories.  CT scan of the abdomen without any acute findings.  Patient admits that she has been told that the marijuana use is probably responsible.  Final Clinical Impression(s) / ED Diagnoses Final diagnoses:  Cannabinoid hyperemesis syndrome  Hypokalemia    Rx / DC Orders ED Discharge Orders         Ordered     promethazine (PHENERGAN) 25 MG tablet  Every 6 hours PRN     07/24/19 1419    potassium chloride SA (KLOR-CON) 20  MEQ tablet  2 times daily     07/24/19 1419           Vanetta Mulders, MD 07/24/19 1438

## 2019-07-24 NOTE — Discharge Instructions (Signed)
Gave you prescription for some oral Phenergan you cannot use along with your suppository.  Also take the potassium as directed.  For the next several days.  Try to avoid cannabis use.  Most likely the cause of the persistent vomiting.  Return for any new or worse symptoms.

## 2019-10-14 DIAGNOSIS — Z3202 Encounter for pregnancy test, result negative: Secondary | ICD-10-CM | POA: Diagnosis not present

## 2019-10-14 DIAGNOSIS — N939 Abnormal uterine and vaginal bleeding, unspecified: Secondary | ICD-10-CM | POA: Diagnosis not present

## 2019-10-14 DIAGNOSIS — R1031 Right lower quadrant pain: Secondary | ICD-10-CM | POA: Diagnosis not present

## 2019-10-14 DIAGNOSIS — D649 Anemia, unspecified: Secondary | ICD-10-CM | POA: Diagnosis not present

## 2019-12-22 DIAGNOSIS — R300 Dysuria: Secondary | ICD-10-CM | POA: Diagnosis not present

## 2020-03-23 ENCOUNTER — Other Ambulatory Visit: Payer: Medicaid Other

## 2020-03-27 DIAGNOSIS — Z9889 Other specified postprocedural states: Secondary | ICD-10-CM | POA: Diagnosis not present

## 2020-03-27 DIAGNOSIS — R52 Pain, unspecified: Secondary | ICD-10-CM | POA: Diagnosis not present

## 2020-03-27 DIAGNOSIS — R102 Pelvic and perineal pain: Secondary | ICD-10-CM | POA: Diagnosis not present

## 2020-03-27 DIAGNOSIS — R1032 Left lower quadrant pain: Secondary | ICD-10-CM | POA: Diagnosis not present

## 2020-03-27 DIAGNOSIS — R112 Nausea with vomiting, unspecified: Secondary | ICD-10-CM | POA: Diagnosis not present

## 2020-03-27 DIAGNOSIS — R58 Hemorrhage, not elsewhere classified: Secondary | ICD-10-CM | POA: Diagnosis not present

## 2020-03-27 DIAGNOSIS — N939 Abnormal uterine and vaginal bleeding, unspecified: Secondary | ICD-10-CM | POA: Diagnosis not present

## 2020-03-27 DIAGNOSIS — R935 Abnormal findings on diagnostic imaging of other abdominal regions, including retroperitoneum: Secondary | ICD-10-CM | POA: Diagnosis not present

## 2020-03-27 DIAGNOSIS — R1031 Right lower quadrant pain: Secondary | ICD-10-CM | POA: Diagnosis not present

## 2020-10-01 ENCOUNTER — Other Ambulatory Visit: Payer: Self-pay

## 2020-10-01 ENCOUNTER — Emergency Department (HOSPITAL_COMMUNITY)
Admission: EM | Admit: 2020-10-01 | Discharge: 2020-10-01 | Disposition: A | Discharge status: Home / Self Care | Payer: Medicaid Other | Attending: Emergency Medicine | Admitting: Emergency Medicine

## 2020-10-01 ENCOUNTER — Encounter (HOSPITAL_COMMUNITY): Payer: Self-pay

## 2020-10-01 DIAGNOSIS — R102 Pelvic and perineal pain: Secondary | ICD-10-CM | POA: Insufficient documentation

## 2020-10-01 DIAGNOSIS — R110 Nausea: Secondary | ICD-10-CM | POA: Diagnosis not present

## 2020-10-01 DIAGNOSIS — R1111 Vomiting without nausea: Secondary | ICD-10-CM | POA: Diagnosis not present

## 2020-10-01 DIAGNOSIS — R52 Pain, unspecified: Secondary | ICD-10-CM | POA: Diagnosis not present

## 2020-10-01 DIAGNOSIS — T50991A Poisoning by other drugs, medicaments and biological substances, accidental (unintentional), initial encounter: Secondary | ICD-10-CM | POA: Diagnosis not present

## 2020-10-01 DIAGNOSIS — R112 Nausea with vomiting, unspecified: Secondary | ICD-10-CM | POA: Diagnosis not present

## 2020-10-01 DIAGNOSIS — F10120 Alcohol abuse with intoxication, uncomplicated: Secondary | ICD-10-CM

## 2020-10-01 DIAGNOSIS — F10929 Alcohol use, unspecified with intoxication, unspecified: Secondary | ICD-10-CM | POA: Diagnosis not present

## 2020-10-01 DIAGNOSIS — R1084 Generalized abdominal pain: Secondary | ICD-10-CM | POA: Diagnosis not present

## 2020-10-01 LAB — URINALYSIS, ROUTINE W REFLEX MICROSCOPIC
Bacteria, UA: NONE SEEN
Bilirubin Urine: NEGATIVE
Glucose, UA: NEGATIVE mg/dL
Ketones, ur: 5 mg/dL — AB
Leukocytes,Ua: NEGATIVE
Nitrite: NEGATIVE
Protein, ur: NEGATIVE mg/dL
Specific Gravity, Urine: 1.011 (ref 1.005–1.030)
pH: 9 — ABNORMAL HIGH (ref 5.0–8.0)

## 2020-10-01 LAB — COMPREHENSIVE METABOLIC PANEL
ALT: 24 U/L (ref 0–44)
AST: 24 U/L (ref 15–41)
Albumin: 4.3 g/dL (ref 3.5–5.0)
Alkaline Phosphatase: 55 U/L (ref 38–126)
Anion gap: 13 (ref 5–15)
BUN: 9 mg/dL (ref 6–20)
CO2: 18 mmol/L — ABNORMAL LOW (ref 22–32)
Calcium: 9.7 mg/dL (ref 8.9–10.3)
Chloride: 106 mmol/L (ref 98–111)
Creatinine, Ser: 0.75 mg/dL (ref 0.44–1.00)
GFR, Estimated: >60 mL/min (ref >60)
Glucose, Bld: 146 mg/dL — ABNORMAL HIGH (ref 70–99)
Potassium: 3.3 mmol/L — ABNORMAL LOW (ref 3.5–5.1)
Sodium: 137 mmol/L (ref 135–145)
Total Bilirubin: 0.5 mg/dL (ref 0.3–1.2)
Total Protein: 8 g/dL (ref 6.5–8.1)

## 2020-10-01 LAB — CBC WITH DIFFERENTIAL/PLATELET
Abs Immature Granulocytes: 0.01 10*3/uL (ref 0.00–0.07)
Basophils Absolute: 0 10*3/uL (ref 0.0–0.1)
Basophils Relative: 1 %
Eosinophils Absolute: 0 10*3/uL (ref 0.0–0.5)
Eosinophils Relative: 1 %
HCT: 34.9 % — ABNORMAL LOW (ref 36.0–46.0)
Hemoglobin: 11.4 g/dL — ABNORMAL LOW (ref 12.0–15.0)
Immature Granulocytes: 0 %
Lymphocytes Relative: 36 %
Lymphs Abs: 1.9 10*3/uL (ref 0.7–4.0)
MCH: 26.2 pg (ref 26.0–34.0)
MCHC: 32.7 g/dL (ref 30.0–36.0)
MCV: 80.2 fL (ref 80.0–100.0)
Monocytes Absolute: 0.4 10*3/uL (ref 0.1–1.0)
Monocytes Relative: 8 %
Neutro Abs: 2.9 10*3/uL (ref 1.7–7.7)
Neutrophils Relative %: 54 %
Platelets: 210 10*3/uL (ref 150–400)
RBC: 4.35 MIL/uL (ref 3.87–5.11)
RDW: 17.2 % — ABNORMAL HIGH (ref 11.5–15.5)
WBC: 5.3 10*3/uL (ref 4.0–10.5)
nRBC: 0 % (ref 0.0–0.2)

## 2020-10-01 LAB — LIPASE, BLOOD: Lipase: 29 U/L (ref 11–51)

## 2020-10-01 LAB — HCG, QUANTITATIVE, PREGNANCY: hCG, Beta Chain, Quant, S: 4 m[IU]/mL (ref <5)

## 2020-10-01 MED ORDER — SODIUM CHLORIDE 0.9 % IV BOLUS
1000.0000 mL | Freq: Once | INTRAVENOUS | Status: AC
  Administered 2020-10-01: 1000 mL via INTRAVENOUS

## 2020-10-01 MED ORDER — DIPHENHYDRAMINE HCL 50 MG/ML IJ SOLN: 12.5000 mg | Freq: Once | INTRAMUSCULAR | Status: DC

## 2020-10-01 MED ORDER — KETOROLAC TROMETHAMINE 30 MG/ML IJ SOLN
15.0000 mg | Freq: Once | INTRAMUSCULAR | Status: AC
  Administered 2020-10-01: 15 mg via INTRAVENOUS
  Filled 2020-10-01: qty 1

## 2020-10-01 MED ORDER — ONDANSETRON HCL 4 MG/2ML IJ SOLN
4.0000 mg | Freq: Once | INTRAMUSCULAR | Status: AC
  Administered 2020-10-01: 4 mg via INTRAVENOUS
  Filled 2020-10-01: qty 2

## 2020-10-01 MED ORDER — PROCHLORPERAZINE MALEATE 10 MG PO TABS: 10.0000 mg | ORAL_TABLET | Freq: Two times a day (BID) | ORAL | 0 refills | Status: DC | PRN

## 2020-10-01 MED ORDER — SODIUM CHLORIDE 0.9 % IV SOLN
12.5000 mg | Freq: Four times a day (QID) | INTRAVENOUS | Status: DC | PRN
  Filled 2020-10-01: qty 0.5

## 2020-10-01 MED ORDER — ALUM & MAG HYDROXIDE-SIMETH 200-200-20 MG/5ML PO SUSP
30.0000 mL | Freq: Once | ORAL | Status: DC
  Filled 2020-10-01: qty 30

## 2020-10-01 MED ORDER — SODIUM CHLORIDE 0.9 % IV BOLUS: 1000.0000 mL | Freq: Once | INTRAVENOUS | Status: DC

## 2020-10-01 MED ORDER — PROCHLORPERAZINE EDISYLATE 10 MG/2ML IJ SOLN
10.0000 mg | Freq: Once | INTRAMUSCULAR | Status: AC
  Administered 2020-10-01: 10 mg via INTRAVENOUS
  Filled 2020-10-01: qty 2

## 2020-10-01 NOTE — ED Notes (Addendum)
Patient able to handle fluid challenge.  Provider made aware.

## 2020-10-01 NOTE — ED Notes (Signed)
Pt requesting something to drink. Explained to pt she must be seen by MD for exam before given anything to drink. Pt actively vomiting.

## 2020-10-01 NOTE — Discharge Instructions (Addendum)
Your lab tests today are reassuring.  Rest, continue drinking plenty of fluids to continue your rehydration including water, gingerale, sprite, juices or gatorade.  Your lab tests are reassuring.  You have been prescribed compazine to take if your nausea returns.

## 2020-10-01 NOTE — ED Triage Notes (Signed)
Patient brought in by [facility] for ETOH poisoning.  States that she had 4 double shots of Pat[place].  Patient states that she is having severe abdominal pain.

## 2020-10-01 NOTE — ED Provider Notes (Signed)
[facility] EMERGENCY DEPARTMENT Provider Note   CSN: [id number] Arrival date & time: 10/01/20  5009     History Chief Complaint  Patient presents with   Abdominal Pain   Alcohol Intoxication    Taylor Poole is a 20 y.o. female presenting for evaluation of uncontrolled nausea and vomiting along with mild abdominal discomfort, denying overt or localizing abdominal pain.  She endorses drinking too much alcohol yesterday and had no food on her stomach which she states is the reason for her feeling bad.  She had 4 double shots of Patron, her last drink was at 3 AM today, she denies currently feeling intoxicated.  She does feel dehydrated and weak.  She denies any other symptoms including hematemesis, diarrhea, urinary complaints.  She has had no treatments prior to arrival.  She does report an increased thirst and is asking for water or ice chips.    The history is provided by the patient.      Past Medical History:  Diagnosis Date   Anemia    Dehydration 10/09/2016   Depression    Medical history non-contributory     Patient Active Problem List   Diagnosis Date Noted   Indication for care in labor or delivery 04/05/2019   SVD (spontaneous vaginal delivery) 04/05/2019   Marijuana use 10/13/2018   Supervision of normal pregnancy 10/12/2018   H/O Gestational thrombocytopenia (HCC) 02/12/2018    Past Surgical History:  Procedure Laterality Date   NO PAST SURGERIES     WISDOM TOOTH EXTRACTION  2015     OB History     Gravida  2   Para  2   Term  2   Preterm      AB      Living  2      SAB      IAB      Ectopic      Multiple  0   Live Births  2           Family History  Problem Relation Age of Onset   Cancer Maternal Grandfather     Social History   Tobacco Use   Smoking status: Never   Smokeless tobacco: Never  Vaping Use   Vaping Use: Never used  Substance Use Topics   Alcohol use: No   Drug use: Not Currently    Types: Marijuana     Home Medications Prior to Admission medications   Medication Sig Start Date End Date Taking? Authorizing Provider  cyclobenzaprine (FLEXERIL) 10 MG tablet Take 1 tablet (10 mg total) by mouth 2 (two) times daily as needed for muscle spasms. Patient not taking: Reported on 07/24/2019 05/14/19   Mesner, Barbara Cower, MD  ibuprofen (ADVIL) 600 MG tablet Take 1 tablet (600 mg total) by mouth every 6 (six) hours. Patient not taking: Reported on 07/24/2019 04/06/19   Donette Larry, CNM  potassium chloride SA (KLOR-CON) 20 MEQ tablet Take 1 tablet (20 mEq total) by mouth 2 (two) times daily. 07/24/19   Vanetta Mulders, MD  promethazine (PHENERGAN) 25 MG suppository Place 1 suppository (25 mg total) rectally every 6 (six) hours as needed for nausea or vomiting. 07/22/19   Bethann Berkshire, MD  promethazine (PHENERGAN) 25 MG tablet Take 1 tablet (25 mg total) by mouth every 6 (six) hours as needed. 07/24/19   Vanetta Mulders, MD    Allergies    Patient has no known allergies.  Review of Systems   Review of Systems  Constitutional:  Negative  for fever.  HENT:  Negative for congestion and sore throat.   Eyes: Negative.   Respiratory:  Negative for chest tightness and shortness of breath.   Cardiovascular:  Negative for chest pain.  Gastrointestinal:  Positive for abdominal pain, nausea and vomiting. Negative for abdominal distention.  Genitourinary: Negative.   Musculoskeletal:  Negative for arthralgias, joint swelling and neck pain.  Skin: Negative.  Negative for rash and wound.  Neurological:  Positive for weakness. Negative for dizziness, light-headedness, numbness and headaches.  Psychiatric/Behavioral: Negative.    All other systems reviewed and are negative.  Physical Exam Updated Vital Signs BP (!) 117/102   Pulse 88   Temp 97.6 F (36.4 C) (Axillary)   Resp 12   Ht 5\' 4"  (1.626 m)   Wt 54.4 kg   SpO2 100%   BMI 20.60 kg/m   Physical Exam Vitals and nursing note reviewed.  HENT:      Head: Normocephalic and atraumatic.     Mouth/Throat:     Comments: Dry buccal mucosa Eyes:     Conjunctiva/sclera: Conjunctivae normal.  Cardiovascular:     Rate and Rhythm: Normal rate and regular rhythm.     Heart sounds: Normal heart sounds.  Pulmonary:     Effort: Pulmonary effort is normal.     Breath sounds: Normal breath sounds. No wheezing.  Abdominal:     General: Bowel sounds are normal.     Palpations: Abdomen is soft.     Tenderness: There is generalized abdominal tenderness. There is no guarding or rebound.     Comments: Mild generalized abdominal discomfort, no guarding or rebound.  Abdomen is flat and soft.  Musculoskeletal:        General: Normal range of motion.     Cervical back: Normal range of motion.  Skin:    General: Skin is warm and dry.  Neurological:     General: No focal deficit present.     Mental Status: She is alert.  Psychiatric:     Comments: Cooperative    ED Results / Procedures / Treatments   Labs (all labs ordered are listed, but only abnormal results are displayed) Labs Reviewed  CBC WITH DIFFERENTIAL/PLATELET  COMPREHENSIVE METABOLIC PANEL  LIPASE, BLOOD  URINALYSIS, ROUTINE W REFLEX MICROSCOPIC  HCG, QUANTITATIVE, PREGNANCY    EKG None  Radiology No results found.  Procedures Procedures   Medications Ordered in ED Medications  sodium chloride 0.9 % bolus 1,000 mL (has no administration in time range)  ondansetron (ZOFRAN) injection 4 mg (has no administration in time range)    ED Course  I have reviewed the triage vital signs and the nursing notes.  Pertinent labs & imaging results that were available during my care of the patient were reviewed by me and considered in my medical decision making (see chart for details).    MDM Rules/Calculators/A&P                           Pt presenting with symptoms and hx suggesting hangover from binge drinking last night.  She was given IV fluids, zofran not fully effective,  therefore given compazine which better controlled her emesis.  She was able to tolerate PO intake after receiving this medicine.  She is currently menstruating which explains blood seen in UA - had complaints of pelvic cramping which improved  after giving IV toradol.  At time of dc, informed that pt nausea returned, vomiting.  Offered additional  fluids, phenergan, pt deferred, stating ready to go home.  Final Clinical Impression(s) / ED Diagnoses Final diagnoses:  None    Rx / DC Orders ED Discharge Orders     None        Victoriano Lain 10/01/20 1412    Mancel Bale, MD 10/01/20 [phone]

## 2020-12-21 DIAGNOSIS — R0689 Other abnormalities of breathing: Secondary | ICD-10-CM | POA: Diagnosis not present

## 2020-12-21 DIAGNOSIS — R000 Tachycardia, unspecified: Secondary | ICD-10-CM | POA: Diagnosis not present

## 2020-12-21 DIAGNOSIS — R457 State of emotional shock and stress, unspecified: Secondary | ICD-10-CM | POA: Diagnosis not present

## 2021-01-08 ENCOUNTER — Emergency Department (HOSPITAL_COMMUNITY)
Admission: EM | Admit: 2021-01-08 | Discharge: 2021-01-08 | Disposition: A | Discharge status: Home / Self Care | Payer: Medicaid Other | Attending: Emergency Medicine | Admitting: Emergency Medicine

## 2021-01-08 ENCOUNTER — Other Ambulatory Visit: Payer: Self-pay

## 2021-01-08 ENCOUNTER — Encounter (HOSPITAL_COMMUNITY): Payer: Self-pay | Admitting: *Deleted

## 2021-01-08 DIAGNOSIS — N898 Other specified noninflammatory disorders of vagina: Secondary | ICD-10-CM | POA: Diagnosis not present

## 2021-01-08 DIAGNOSIS — N899 Noninflammatory disorder of vagina, unspecified: Secondary | ICD-10-CM | POA: Insufficient documentation

## 2021-01-08 DIAGNOSIS — R21 Rash and other nonspecific skin eruption: Secondary | ICD-10-CM | POA: Diagnosis present

## 2021-01-08 DIAGNOSIS — L089 Local infection of the skin and subcutaneous tissue, unspecified: Secondary | ICD-10-CM | POA: Insufficient documentation

## 2021-01-08 NOTE — ED Triage Notes (Signed)
Boil on vaginal area for several months intermittent

## 2021-01-08 NOTE — ED Provider Notes (Signed)
[facility] EMERGENCY DEPARTMENT Provider Note   CSN: [id number] Arrival date & time: 01/08/21  6568     History Chief Complaint  Patient presents with   Recurrent Skin Infections    Taylor Poole is a 20 y.o. female.  Pt reports she has a rash on her right suprapubic area.  Pt reports former partner had a herpetic looking rash.  Pt reports she has had a similar rash in the same area in the past.    The history is provided by the patient. No language interpreter was used.  Rash Location:  Pelvis Pelvic rash location:  Groin Quality: itchiness   Severity:  Mild Onset quality:  Gradual Timing:  Constant Progression:  Worsening Chronicity:  New Relieved by:  Nothing Worsened by:  Nothing Ineffective treatments:  None tried Associated symptoms: no abdominal pain       Past Medical History:  Diagnosis Date   Anemia    Dehydration 10/09/2016   Depression    Medical history non-contributory     Patient Active Problem List   Diagnosis Date Noted   Indication for care in labor or delivery 04/05/2019   SVD (spontaneous vaginal delivery) 04/05/2019   Marijuana use 10/13/2018   Supervision of normal pregnancy 10/12/2018   H/O Gestational thrombocytopenia (HCC) 02/12/2018    Past Surgical History:  Procedure Laterality Date   NO PAST SURGERIES     WISDOM TOOTH EXTRACTION  2015     OB History     Gravida  2   Para  2   Term  2   Preterm      AB      Living  2      SAB      IAB      Ectopic      Multiple  0   Live Births  2           Family History  Problem Relation Age of Onset   Cancer Maternal Grandfather     Social History   Tobacco Use   Smoking status: Never   Smokeless tobacco: Never  Vaping Use   Vaping Use: Never used  Substance Use Topics   Alcohol use: No   Drug use: Not Currently    Types: Marijuana    Home Medications Prior to Admission medications   Medication Sig Start Date End Date Taking? Authorizing  Provider  potassium chloride SA (KLOR-CON) 20 MEQ tablet Take 1 tablet (20 mEq total) by mouth 2 (two) times daily. 07/24/19   Vanetta Mulders, MD  prochlorperazine (COMPAZINE) 10 MG tablet Take 1 tablet (10 mg total) by mouth 2 (two) times daily as needed for nausea or vomiting. 10/01/20   Idol, Raynelle Fanning, PA-C  promethazine (PHENERGAN) 25 MG suppository Place 1 suppository (25 mg total) rectally every 6 (six) hours as needed for nausea or vomiting. 07/22/19   Bethann Berkshire, MD  promethazine (PHENERGAN) 25 MG tablet Take 1 tablet (25 mg total) by mouth every 6 (six) hours as needed. 07/24/19   Vanetta Mulders, MD    Allergies    Patient has no known allergies.  Review of Systems   Review of Systems  Gastrointestinal:  Negative for abdominal pain.  Skin:  Positive for rash.  All other systems reviewed and are negative.  Physical Exam Updated Vital Signs BP 115/69 (BP Location: Right Arm)   Pulse 64   Temp 98.2 F (36.8 C) (Oral)   Resp 17   Ht 5\' 2"  (1.575 m)  Wt 62.8 kg   SpO2 100%   BMI 25.32 kg/m   Physical Exam Vitals and nursing note reviewed.  Constitutional:      Appearance: She is well-developed.  HENT:     Head: Normocephalic.  Cardiovascular:     Rate and Rhythm: Normal rate.  Pulmonary:     Effort: Pulmonary effort is normal.  Abdominal:     General: There is no distension.  Musculoskeletal:        General: Normal range of motion.     Cervical back: Normal range of motion.  Skin:    Comments: Dried 71mm area right suprapubic,  Neurological:     General: No focal deficit present.     Mental Status: She is alert and oriented to person, place, and time.  Psychiatric:        Mood and Affect: Mood normal.    ED Results / Procedures / Treatments   Labs (all labs ordered are listed, but only abnormal results are displayed) Labs Reviewed  HSV CULTURE AND TYPING    EKG None  Radiology No results found.  Procedures Procedures   Medications Ordered in  ED Medications - No data to display  ED Course  I have reviewed the triage vital signs and the nursing notes.  Pertinent labs & imaging results that were available during my care of the patient were reviewed by me and considered in my medical decision making (see chart for details).    MDM Rules/Calculators/A&P                           MDM:  Herpes testing ordered.  I suspect folliculitis second to shaving.  Pt advised to follow up at [facility] departmet[facility]  Final Clinical Impression(s) / ED Diagnoses Final diagnoses:  Vaginal lesion    Rx / DC Orders ED Discharge Orders     None     An After Visit Summary was printed and given to the patient.    Elson Areas, [place] 01/08/21 1318    Maia Plan, MD 01/14/21 1414

## 2021-01-08 NOTE — Discharge Instructions (Addendum)
Herpes culture is pending.

## 2021-01-10 ENCOUNTER — Encounter (HOSPITAL_COMMUNITY): Payer: Self-pay | Admitting: Emergency Medicine

## 2021-01-10 ENCOUNTER — Emergency Department (HOSPITAL_COMMUNITY)
Admission: EM | Admit: 2021-01-10 | Discharge: 2021-01-10 | Disposition: A | Discharge status: Home / Self Care | Payer: Medicaid Other | Attending: Emergency Medicine | Admitting: Emergency Medicine

## 2021-01-10 ENCOUNTER — Other Ambulatory Visit: Payer: Self-pay

## 2021-01-10 DIAGNOSIS — K1370 Unspecified lesions of oral mucosa: Secondary | ICD-10-CM | POA: Insufficient documentation

## 2021-01-10 DIAGNOSIS — Z202 Contact with and (suspected) exposure to infections with a predominantly sexual mode of transmission: Secondary | ICD-10-CM | POA: Diagnosis not present

## 2021-01-10 DIAGNOSIS — N898 Other specified noninflammatory disorders of vagina: Secondary | ICD-10-CM | POA: Diagnosis not present

## 2021-01-10 DIAGNOSIS — Z711 Person with feared health complaint in whom no diagnosis is made: Secondary | ICD-10-CM

## 2021-01-10 LAB — URINALYSIS, ROUTINE W REFLEX MICROSCOPIC
Bacteria, UA: NONE SEEN
Bilirubin Urine: NEGATIVE
Glucose, UA: NEGATIVE mg/dL
Hgb urine dipstick: NEGATIVE
Ketones, ur: 5 mg/dL — AB
Leukocytes,Ua: NEGATIVE
Nitrite: NEGATIVE
Protein, ur: NEGATIVE mg/dL
Specific Gravity, Urine: 1.027 (ref 1.005–1.030)
pH: 6 (ref 5.0–8.0)

## 2021-01-10 LAB — PREGNANCY, URINE: Preg Test, Ur: NEGATIVE

## 2021-01-10 LAB — HSV CULTURE AND TYPING

## 2021-01-10 MED ORDER — CEFTRIAXONE SODIUM 500 MG IJ SOLR
500.0000 mg | Freq: Once | INTRAMUSCULAR | Status: AC
  Administered 2021-01-10: 500 mg via INTRAMUSCULAR
  Filled 2021-01-10: qty 500

## 2021-01-10 MED ORDER — STERILE WATER FOR INJECTION IJ SOLN
INTRAMUSCULAR | Status: AC
  Filled 2021-01-10: qty 10

## 2021-01-10 MED ORDER — DOXYCYCLINE HYCLATE 100 MG PO CAPS: 100.0000 mg | ORAL_CAPSULE | Freq: Two times a day (BID) | ORAL | 0 refills | Status: DC

## 2021-01-10 NOTE — ED Provider Notes (Signed)
[facility] EMERGENCY DEPARTMENT Provider Note   CSN: [id number] Arrival date & time: 01/10/21  0001     History Chief Complaint  Patient presents with   Rash    Taylor Poole is a 20 y.o. female.  HPI     This is a 20 year old female with a history of anemia who presents with concerns for a lesion on her lip.  She was seen and evaluated yesterday with concerns for herpes.  She has a partner which she states has a concerning lesion on his penis.  He does not have any definite STD.  She was swabbed for herpes although the provider who saw her yesterday felt that it was more consistent with folliculitis.  Patient states that she noted burning to the lower lip 30 minutes prior to arrival and she noted a lesion.  She states that it feels dry.  She also states that she has some vaginal discharge.  When asked if she was concerned regarding other STDs she states "yes and that is why I was here yesterday but she did not do anything for me."  She has not followed up with health department.  She denies abdominal pain.  She is unsure if she may be pregnant.  Last menstrual period was October 7.  Past Medical History:  Diagnosis Date   Anemia    Dehydration 10/09/2016   Depression    Medical history non-contributory     Patient Active Problem List   Diagnosis Date Noted   Indication for care in labor or delivery 04/05/2019   SVD (spontaneous vaginal delivery) 04/05/2019   Marijuana use 10/13/2018   Supervision of normal pregnancy 10/12/2018   H/O Gestational thrombocytopenia (HCC) 02/12/2018    Past Surgical History:  Procedure Laterality Date   NO PAST SURGERIES     WISDOM TOOTH EXTRACTION  2015     OB History     Gravida  2   Para  2   Term  2   Preterm      AB      Living  2      SAB      IAB      Ectopic      Multiple  0   Live Births  2           Family History  Problem Relation Age of Onset   Cancer Maternal Grandfather     Social History    Tobacco Use   Smoking status: Never   Smokeless tobacco: Never  Vaping Use   Vaping Use: Never used  Substance Use Topics   Alcohol use: No   Drug use: Not Currently    Types: Marijuana    Home Medications Prior to Admission medications   Medication Sig Start Date End Date Taking? Authorizing Provider  doxycycline (VIBRAMYCIN) 100 MG capsule Take 1 capsule (100 mg total) by mouth 2 (two) times daily. 01/10/21  Yes Ophie Burrowes, Mayer Masker, MD  potassium chloride SA (KLOR-CON) 20 MEQ tablet Take 1 tablet (20 mEq total) by mouth 2 (two) times daily. 07/24/19   Vanetta Mulders, MD  prochlorperazine (COMPAZINE) 10 MG tablet Take 1 tablet (10 mg total) by mouth 2 (two) times daily as needed for nausea or vomiting. 10/01/20   Idol, Raynelle Fanning, PA-C  promethazine (PHENERGAN) 25 MG suppository Place 1 suppository (25 mg total) rectally every 6 (six) hours as needed for nausea or vomiting. 07/22/19   Bethann Berkshire, MD  promethazine (PHENERGAN) 25 MG tablet Take 1 tablet (  25 mg total) by mouth every 6 (six) hours as needed. 07/24/19   Vanetta Mulders, MD    Allergies    Patient has no known allergies.  Review of Systems   Review of Systems  Constitutional:  Negative for fever.  Respiratory:  Negative for shortness of breath.   Cardiovascular:  Negative for leg swelling.  Gastrointestinal:  Negative for abdominal pain.  Genitourinary:  Positive for genital sores and vaginal discharge.  All other systems reviewed and are negative.  Physical Exam Updated Vital Signs BP (!) 146/93   Pulse 81   Temp 98 F (36.7 C)   Resp 16   Ht 1.575 m (5\' 2" )   Wt 62.8 kg   LMP 12/15/2020   SpO2 100%   BMI 25.32 kg/m   Physical Exam Vitals and nursing note reviewed.  Constitutional:      Appearance: She is well-developed. She is not ill-appearing.  HENT:     Head: Normocephalic and atraumatic.     Nose: Nose normal.     Mouth/Throat:     Mouth: Mucous membranes are moist.     Comments: No ulcerative  lesion or blisters noted in the mucous membranes or perioral region, and region of concern, there does not appear to be any herpetic-like lesion, there are some small whiteheads/milia type lesions over the chin Eyes:     Pupils: Pupils are equal, round, and reactive to light.  Cardiovascular:     Rate and Rhythm: Normal rate and regular rhythm.  Pulmonary:     Effort: Pulmonary effort is normal. No respiratory distress.  Abdominal:     Palpations: Abdomen is soft.  Musculoskeletal:        General: Normal range of motion.     Cervical back: Neck supple.  Skin:    General: Skin is warm and dry.  Neurological:     Mental Status: She is alert and oriented to person, place, and time.  Psychiatric:        Mood and Affect: Mood normal.    ED Results / Procedures / Treatments   Labs (all labs ordered are listed, but only abnormal results are displayed) Labs Reviewed  URINALYSIS, ROUTINE W REFLEX MICROSCOPIC - Abnormal; Notable for the following components:      Result Value   Ketones, ur 5 (*)    All other components within normal limits  PREGNANCY, URINE  GC/CHLAMYDIA PROBE AMP ([place]) NOT AT [facility]    EKG None  Radiology No results found.  Procedures Procedures   Medications Ordered in ED Medications  cefTRIAXone (ROCEPHIN) injection 500 mg (500 mg Intramuscular Given 01/10/21 0212)  sterile water (preservative free) injection (  Given 01/10/21 0214)    ED Course  I have reviewed the triage vital signs and the nursing notes.  Pertinent labs & imaging results that were available during my care of the patient were reviewed by me and considered in my medical decision making (see chart for details).    MDM Rules/Calculators/A&P                           Patient presents initially with concerns for an oral lesion.  Subsequently she endorsed vaginal discharge.  She was seen and evaluated yesterday for a suprapubic lesion which was thought to be folliculitis.  Patient is  very tearful.  She is highly concerned that she has herpes.  I discussed with her that at this time it did not  appear that she had any evidence of oral herpes.  She has no other symptoms regarding her vaginal discharge.  She is without pelvic pain or abdominal pain.  She was allowed to self swab.  Urinalysis without evidence of UTI and urine pregnancy is negative.  We will test and treat for gonorrhea and chlamydia.  I reviewed her viral culture which at this time has not resulted.  I have offered her Valtrex as she is highly anxious; however, patient declines at this time.  After history, exam, and medical workup I feel the patient has been appropriately medically screened and is safe for discharge home. Pertinent diagnoses were discussed with the patient. Patient was given return precautions.  Final Clinical Impression(s) / ED Diagnoses Final diagnoses:  Concern about STD in female without diagnosis    Rx / DC Orders ED Discharge Orders          Ordered    doxycycline (VIBRAMYCIN) 100 MG capsule  2 times daily        01/10/21 0425             Shon Baton, MD 01/10/21 [phone]

## 2021-01-10 NOTE — Discharge Instructions (Signed)
You were seen today with concerns for STDs.  You were tested and treated.  At this time you do not have any evidence of herpetic outbreak.  Take antibiotics as prescribed.  Follow-up with the health department if you have any ongoing concerns.  You should always use condoms.  Avoid sexual activity for the next 10 days.

## 2021-01-10 NOTE — ED Triage Notes (Signed)
Pt was tested for herpes yesterday and now has bumps to the bottom lip x 30 mins.

## 2021-01-11 LAB — GC/CHLAMYDIA PROBE AMP (~~LOC~~) NOT AT ARMC
Chlamydia: NEGATIVE
Comment: NEGATIVE
Comment: NORMAL
Neisseria Gonorrhea: NEGATIVE

## 2021-03-02 IMAGING — CT CT ABD-PELV W/ CM
2 of 7 series · 14 of 46 positions shown, 18 images · IV contrast (Omnipaque or Isovue)
Comparison: 05/14/2019

CLINICAL DATA: Nausea and vomiting.

EXAM:
CT ABDOMEN AND PELVIS WITH CONTRAST
TECHNIQUE: Multidetector CT imaging of the abdomen and pelvis was performed
using the standard protocol following bolus administration of
intravenous contrast.
CONTRAST:  100mL OMNIPAQUE IOHEXOL 300 MG/ML  SOLN

[Series 2: axial st · axial · 0.57mm/px · z∈[+772,+1162]mm · 11 of 92 slices shown, 15 images]
[im 9/92  soft-tissue]
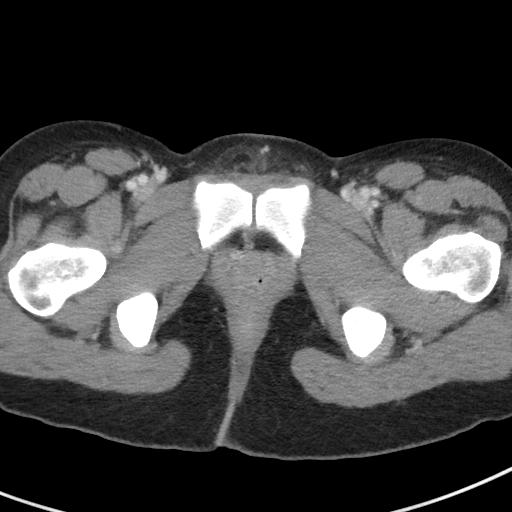
[im 9/92  bone]
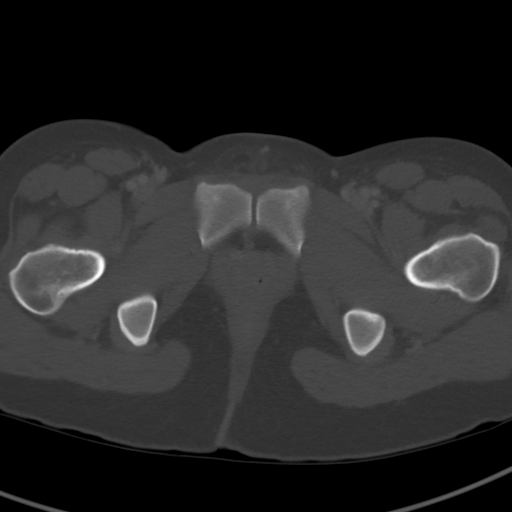
[im 18/92  soft-tissue]
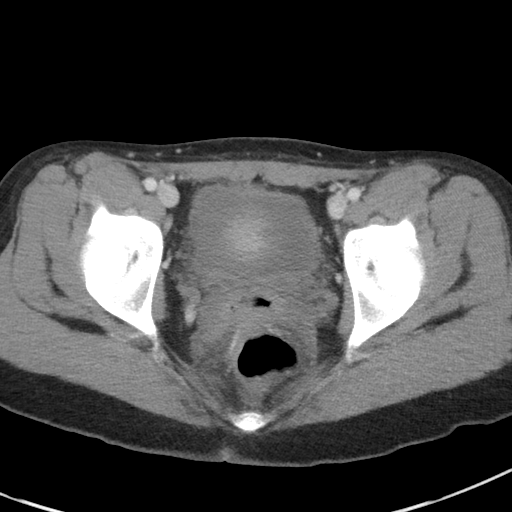
[im 27/92  soft-tissue]
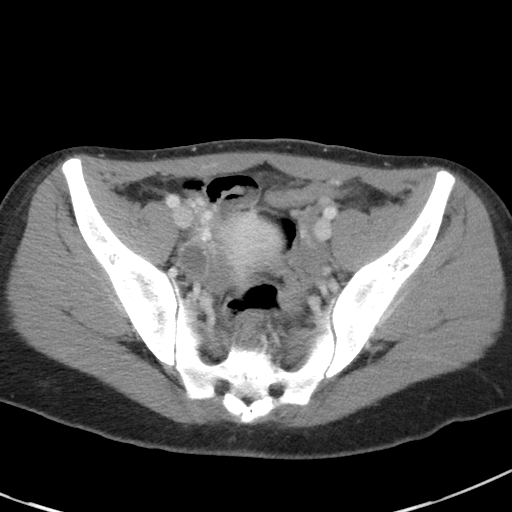
[im 35/92  soft-tissue]
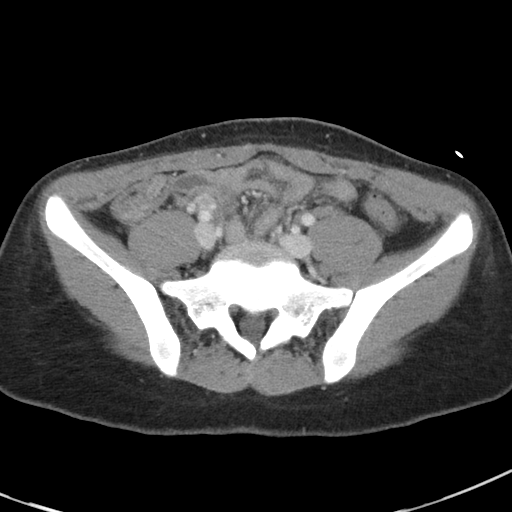
[im 48/92  soft-tissue]
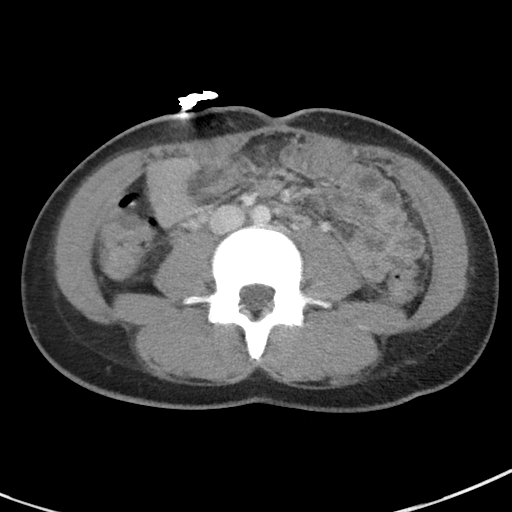
[im 57/92  soft-tissue]
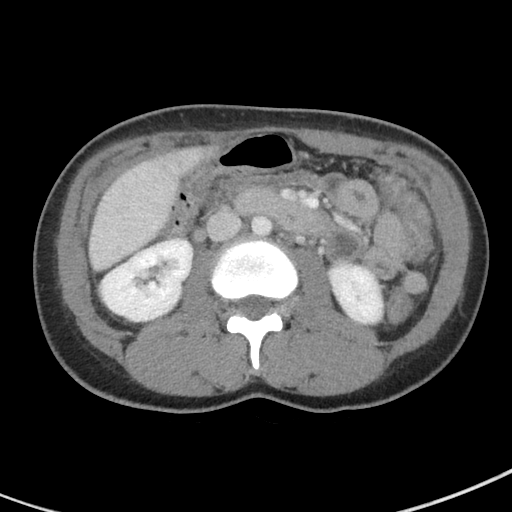
[im 66/92  soft-tissue]
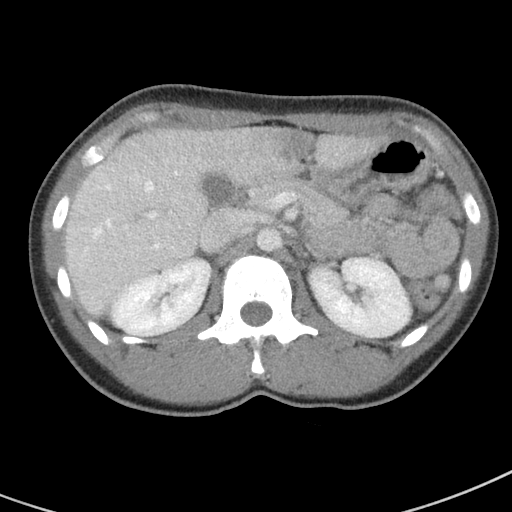
[im 74/92  soft-tissue]
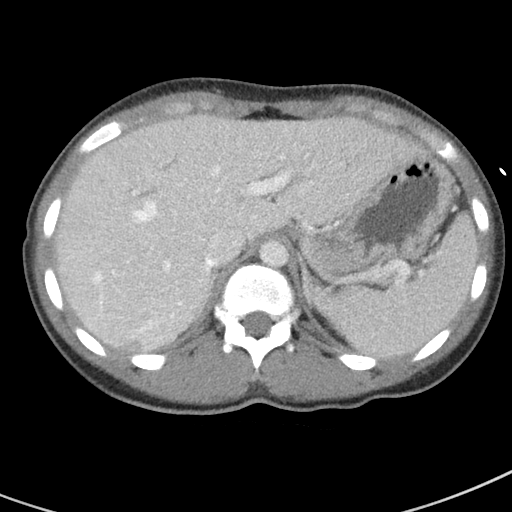
[im 74/92  lung]
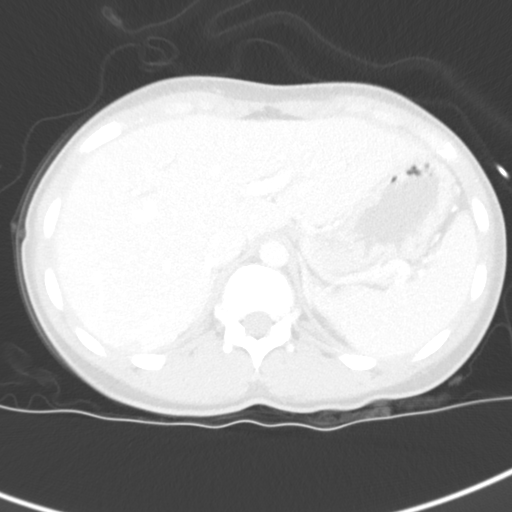
[im 79/92  lung]
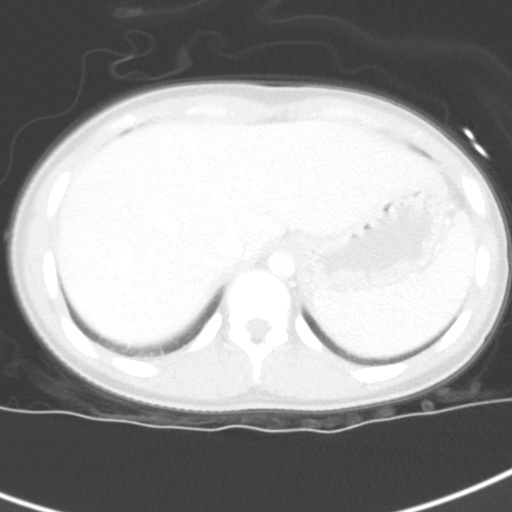
[im 83/92  soft-tissue]
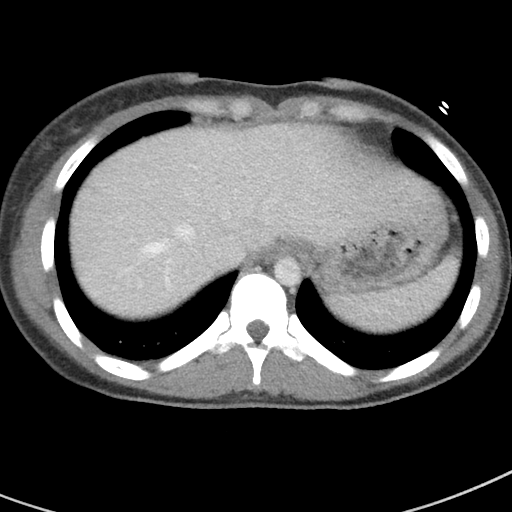
[im 83/92  lung]
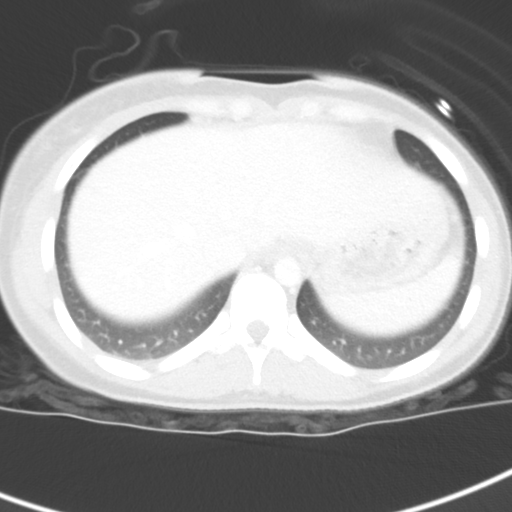
[im 83/92  bone]
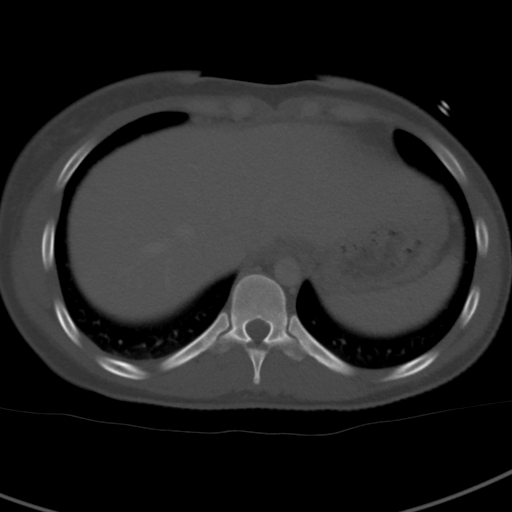
[im 87/92  lung]
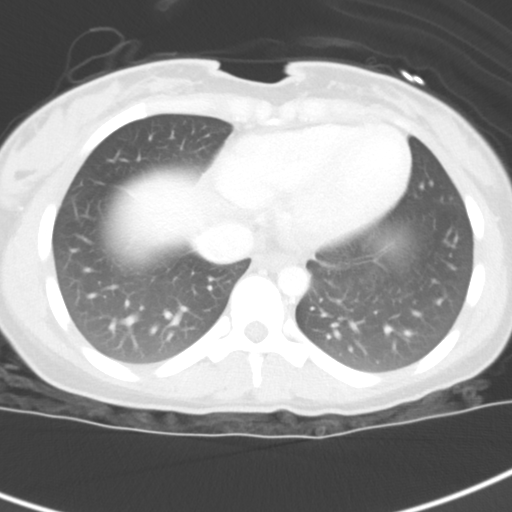

[Series 7: coronal st · coronal · 0.65mm/px · 3 of 76 slices shown]
[im 19/76  soft-tissue]
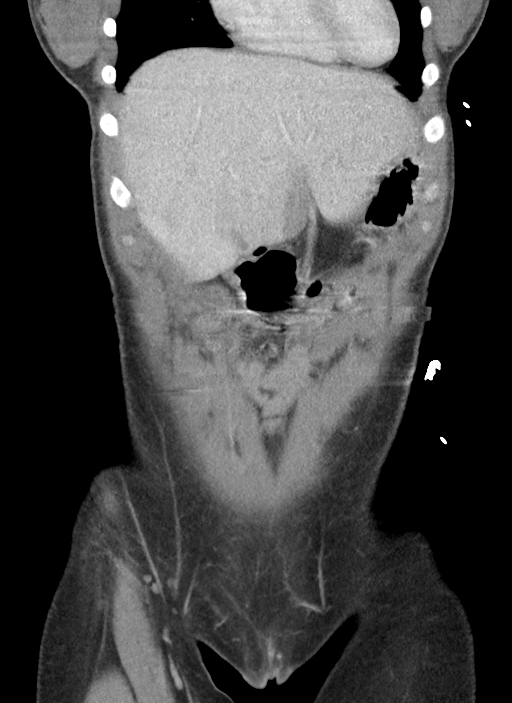
[im 38/76  soft-tissue]
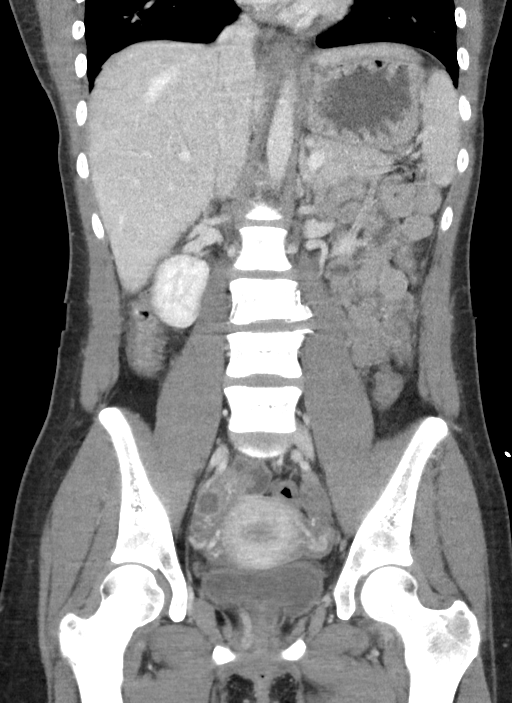
[im 57/76  soft-tissue]
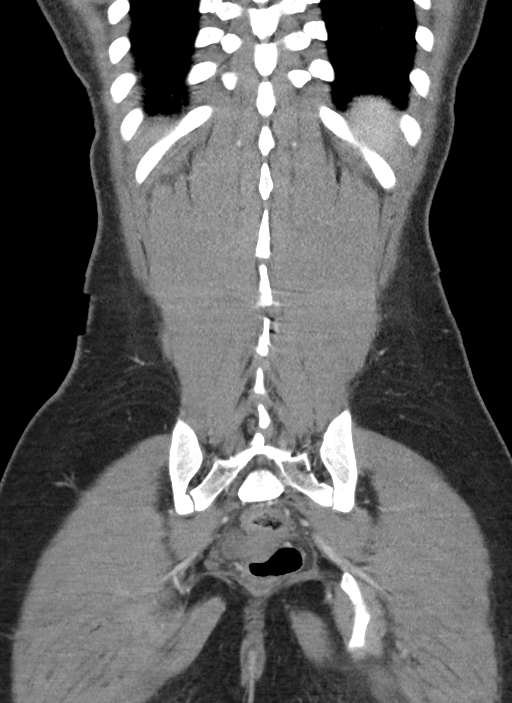

[14 of 46 positions shown; findings below may reference images not displayed]

FINDINGS: Lower chest: Clear lung bases.  Heart normal in size.

Hepatobiliary: Liver normal in size and overall attenuation. Focal
fatty infiltration lies adjacent to the falciform ligament, stable
from the prior CT. No other liver abnormality. Normal gallbladder.
No bile duct dilation.

Pancreas: Unremarkable. No pancreatic ductal dilatation or
surrounding inflammatory changes.

Spleen: Normal in size without focal abnormality.

Adrenals/Urinary Tract: Adrenal glands are unremarkable. Kidneys are
normal, without renal calculi, focal lesion, or hydronephrosis.
Bladder is unremarkable.

Stomach/Bowel: Stomach is within normal limits. Appendix appears
normal. No evidence of bowel wall thickening, distention, or
inflammatory changes.

Vascular/Lymphatic: No significant vascular findings are present. No
enlarged abdominal or pelvic lymph nodes.

Reproductive: Uterus and bilateral adnexa are unremarkable.

Other: No abdominal wall hernia or abnormality. No abdominopelvic
ascites.

Musculoskeletal: No acute or significant osseous findings.
IMPRESSION: 1. No acute findings within the abdomen or pelvis.
2. Focal fatty infiltration adjacent to the falciform ligament of
the liver, stable from the prior CT. No other abnormality.

## 2021-04-12 ENCOUNTER — Other Ambulatory Visit: Payer: Self-pay

## 2021-04-12 ENCOUNTER — Encounter: Payer: Self-pay | Admitting: Emergency Medicine

## 2021-04-12 ENCOUNTER — Ambulatory Visit
Admission: EM | Admit: 2021-04-12 | Discharge: 2021-04-12 | Disposition: A | Discharge status: Home / Self Care | Payer: Medicaid Other | Attending: Emergency Medicine | Admitting: Emergency Medicine

## 2021-04-12 DIAGNOSIS — Z113 Encounter for screening for infections with a predominantly sexual mode of transmission: Secondary | ICD-10-CM | POA: Insufficient documentation

## 2021-04-12 DIAGNOSIS — N9089 Other specified noninflammatory disorders of vulva and perineum: Secondary | ICD-10-CM | POA: Diagnosis not present

## 2021-04-12 DIAGNOSIS — K130 Diseases of lips: Secondary | ICD-10-CM | POA: Diagnosis not present

## 2021-04-12 LAB — POCT URINALYSIS DIP (MANUAL ENTRY)
Bilirubin, UA: NEGATIVE
Blood, UA: NEGATIVE
Glucose, UA: NEGATIVE mg/dL
Ketones, POC UA: NEGATIVE mg/dL
Leukocytes, UA: NEGATIVE
Nitrite, UA: NEGATIVE
Protein Ur, POC: NEGATIVE mg/dL
Spec Grav, UA: 1.025 (ref 1.010–1.025)
Urobilinogen, UA: 2 E.U./dL — AB
pH, UA: 7 (ref 5.0–8.0)

## 2021-04-12 LAB — POCT URINE PREGNANCY: Preg Test, Ur: NEGATIVE

## 2021-04-12 NOTE — ED Provider Notes (Signed)
Roderic Palau    CSN: [id number] Arrival date & time: 04/12/21  1134      History   Chief Complaint Chief Complaint  Patient presents with   Rash    HPI Taylor Poole is a 21 y.o. female.  Patient presents with a rash on her labia intermittently for several weeks.  She shaves but is also concerned for possibility of herpes.  Her sexual partner is sexually active with someone else who has herpes.  She also has a blister lesion on her lower lip.  She would like STD testing, including HIV and syphilis.  She denies fever, chills, abdominal pain, dysuria, pelvic pain, or other symptoms.  LMP: current.   The history is provided by the patient and medical records.   Past Medical History:  Diagnosis Date   Anemia    Dehydration 10/09/2016   Depression    Medical history non-contributory     Patient Active Problem List   Diagnosis Date Noted   Indication for care in labor or delivery 04/05/2019   SVD (spontaneous vaginal delivery) 04/05/2019   Marijuana use 10/13/2018   Supervision of normal pregnancy 10/12/2018   H/O Gestational thrombocytopenia ([place]) 02/12/2018    Past Surgical History:  Procedure Laterality Date   NO PAST SURGERIES     WISDOM TOOTH EXTRACTION  2015    OB History     Gravida  2   Para  2   Term  2   Preterm      AB      Living  2      SAB      IAB      Ectopic      Multiple  0   Live Births  2            Home Medications    Prior to Admission medications   Medication Sig Start Date End Date Taking? Authorizing Provider  doxycycline (VIBRAMYCIN) 100 MG capsule Take 1 capsule (100 mg total) by mouth 2 (two) times daily. 01/10/21   Horton, Barbette Hair, MD  potassium chloride SA (KLOR-CON) 20 MEQ tablet Take 1 tablet (20 mEq total) by mouth 2 (two) times daily. 07/24/19   Fredia Sorrow, MD  prochlorperazine (COMPAZINE) 10 MG tablet Take 1 tablet (10 mg total) by mouth 2 (two) times daily as needed for nausea or vomiting.  10/01/20   Idol, Almyra Free, PA-C  promethazine (PHENERGAN) 25 MG suppository Place 1 suppository (25 mg total) rectally every 6 (six) hours as needed for nausea or vomiting. 07/22/19   Milton Ferguson, MD  promethazine (PHENERGAN) 25 MG tablet Take 1 tablet (25 mg total) by mouth every 6 (six) hours as needed. 07/24/19   Fredia Sorrow, MD    Family History Family History  Problem Relation Age of Onset   Cancer Maternal Grandfather     Social History Social History   Tobacco Use   Smoking status: Never   Smokeless tobacco: Never  Vaping Use   Vaping Use: Never used  Substance Use Topics   Alcohol use: No   Drug use: Not Currently    Types: Marijuana     Allergies   Patient has no known allergies.   Review of Systems Review of Systems  Constitutional:  Negative for chills and fever.  Gastrointestinal:  Negative for abdominal pain and vomiting.  Genitourinary:  Negative for dysuria, hematuria, pelvic pain and vaginal discharge.  Skin:  Positive for rash. Negative for color change.  All other systems  reviewed and are negative.   Physical Exam Triage Vital Signs ED Triage Vitals [04/12/21 1229]  Enc Vitals Group     BP 118/74     Pulse Rate 64     Resp 18     Temp 98.2 F (36.8 C)     Temp src      SpO2 97 %     Weight      Height      Head Circumference      Peak Flow      Pain Score      Pain Loc      Pain Edu?      Excl. in [place]?    No data found.  Updated Vital Signs BP 118/74 (BP Location: Left Arm)    Pulse 64    Temp 98.2 F (36.8 C)    Resp 18    LMP 04/05/2021    SpO2 97%   Visual Acuity Right Eye Distance:   Left Eye Distance:   Bilateral Distance:    Right Eye Near:   Left Eye Near:    Bilateral Near:     Physical Exam Vitals and nursing note reviewed. Exam conducted with a chaperone present Conservation officer, nature).  Constitutional:      General: She is not in acute distress.    Appearance: Normal appearance. She is well-developed. She is not  ill-appearing.  HENT:     Mouth/Throat:     Mouth: Mucous membranes are moist.     Comments: Clustered vesicles on lower lip.  Cardiovascular:     Rate and Rhythm: Normal rate and regular rhythm.     Heart sounds: Normal heart sounds.  Pulmonary:     Effort: Pulmonary effort is normal. No respiratory distress.     Breath sounds: Normal breath sounds.  Abdominal:     Palpations: Abdomen is soft.     Tenderness: There is no abdominal tenderness. There is no guarding or rebound.  Genitourinary:    Exam position: Lithotomy position.     Labia:        Right: Rash present.      Vagina: Bleeding present.    Musculoskeletal:     Cervical back: Neck supple.  Skin:    General: Skin is warm and dry.  Neurological:     Mental Status: She is alert.  Psychiatric:        Mood and Affect: Mood normal.        Behavior: Behavior normal.     UC Treatments / Results  Labs (all labs ordered are listed, but only abnormal results are displayed) Labs Reviewed  POCT URINALYSIS DIP (MANUAL ENTRY) - Abnormal; Notable for the following components:      Result Value   Urobilinogen, UA 2.0 (*)    All other components within normal limits  HSV CULTURE AND TYPING  RPR  HIV ANTIBODY (ROUTINE TESTING W REFLEX)  POCT URINE PREGNANCY  CERVICOVAGINAL ANCILLARY ONLY    EKG   Radiology No results found.  Procedures Procedures (including critical care time)  Medications Ordered in UC Medications - No data to display  Initial Impression / Assessment and Plan / UC Course  I have reviewed the triage vital signs and the nursing notes.  Pertinent labs & imaging results that were available during my care of the patient were reviewed by me and considered in my medical decision making (see chart for details).   Labial lesions, lip lesion, STD screening.  Labial and lip lesions  swabbed for HSV.  Vaginal swab obtained for STD testing.  HIV and RPR also pending.  Discussed that we will call if test  results are positive.  Discussed that she may require treatment at that time.  Discussed that sexual partner(s) may also require treatment.  Instructed patient to abstain from sexual activity for at least 7 days.  Instructed her to follow-up with her PCP or gynecologist if her symptoms are not improving.  Patient agrees to plan of care.    Final Clinical Impressions(s) / UC Diagnoses   Final diagnoses:  Labial lesion  Lip lesion  Screening for STD (sexually transmitted disease)     Discharge Instructions      Your tests are pending.  If your test results are positive, we will call you.  Do not have sexual activity for at least 7 days.    Follow up with your primary care provider or gynecologist if your symptoms are not improving.         ED Prescriptions   None    PDMP not reviewed this encounter.   Sharion Balloon, NP 04/12/21 1336

## 2021-04-12 NOTE — ED Triage Notes (Signed)
Pt here with a concern for herpes on lips and vaginal area x several weeks. Sexual partner has possible exposure to herpes. Pt requesting blood test for herpes.

## 2021-04-12 NOTE — Discharge Instructions (Addendum)
Your tests are pending.  If your test results are positive, we will call you.  Do not have sexual activity for at least 7 days.    Follow up with your primary care provider or gynecologist if your symptoms are not improving.

## 2021-04-13 LAB — CERVICOVAGINAL ANCILLARY ONLY
Bacterial Vaginitis (gardnerella): POSITIVE — AB
Candida Glabrata: NEGATIVE
Candida Vaginitis: NEGATIVE
Chlamydia: NEGATIVE
Comment: NEGATIVE
Comment: NEGATIVE
Comment: NEGATIVE
Comment: NEGATIVE
Comment: NEGATIVE
Comment: NORMAL
Neisseria Gonorrhea: NEGATIVE
Trichomonas: NEGATIVE

## 2021-04-13 LAB — HIV ANTIBODY (ROUTINE TESTING W REFLEX): HIV Screen 4th Generation wRfx: NONREACTIVE

## 2021-04-13 LAB — RPR: RPR Ser Ql: NONREACTIVE

## 2021-04-14 ENCOUNTER — Telehealth (HOSPITAL_COMMUNITY): Payer: Self-pay | Admitting: Emergency Medicine

## 2021-04-14 MED ORDER — METRONIDAZOLE 500 MG PO TABS: 500.0000 mg | ORAL_TABLET | Freq: Two times a day (BID) | ORAL | 0 refills | Status: DC

## 2021-04-15 LAB — HSV CULTURE AND TYPING

## 2021-09-19 ENCOUNTER — Other Ambulatory Visit: Payer: Self-pay

## 2021-09-19 ENCOUNTER — Emergency Department (HOSPITAL_COMMUNITY)
Admission: EM | Admit: 2021-09-19 | Discharge: 2021-09-19 | Discharge status: Against Medical Advice | Payer: Medicaid Other | Attending: Emergency Medicine | Admitting: Emergency Medicine

## 2021-09-19 ENCOUNTER — Encounter (HOSPITAL_COMMUNITY): Payer: Self-pay | Admitting: *Deleted

## 2021-09-19 DIAGNOSIS — N939 Abnormal uterine and vaginal bleeding, unspecified: Secondary | ICD-10-CM | POA: Diagnosis not present

## 2021-09-19 DIAGNOSIS — Z5321 Procedure and treatment not carried out due to patient leaving prior to being seen by health care provider: Secondary | ICD-10-CM | POA: Insufficient documentation

## 2021-09-19 DIAGNOSIS — R1031 Right lower quadrant pain: Secondary | ICD-10-CM | POA: Insufficient documentation

## 2021-09-19 NOTE — ED Triage Notes (Addendum)
Pt in c/o R lower abd pain onset today, reports having vaginal bleeding with wiping, pt reports having an elective abortion 1 mth ago at [redacted] wks pregnant, pt reports hx of ovarian cysts, denies dysuria, denies n/v/d, A&O x4

## 2022-05-31 ENCOUNTER — Emergency Department (HOSPITAL_COMMUNITY)
Admission: EM | Admit: 2022-05-31 | Discharge: 2022-05-31 | Disposition: A | Discharge status: Home / Self Care | Payer: Medicaid Other | Attending: Emergency Medicine | Admitting: Emergency Medicine

## 2022-05-31 ENCOUNTER — Other Ambulatory Visit: Payer: Self-pay

## 2022-05-31 DIAGNOSIS — W260XXA Contact with knife, initial encounter: Secondary | ICD-10-CM | POA: Insufficient documentation

## 2022-05-31 DIAGNOSIS — S6992XA Unspecified injury of left wrist, hand and finger(s), initial encounter: Secondary | ICD-10-CM | POA: Diagnosis present

## 2022-05-31 DIAGNOSIS — S61412A Laceration without foreign body of left hand, initial encounter: Secondary | ICD-10-CM | POA: Diagnosis not present

## 2022-05-31 DIAGNOSIS — Z23 Encounter for immunization: Secondary | ICD-10-CM | POA: Insufficient documentation

## 2022-05-31 MED ORDER — LIDOCAINE HCL (PF) 1 % IJ SOLN
30.0000 mL | Freq: Once | INTRAMUSCULAR | Status: AC
  Administered 2022-05-31: 30 mL
  Filled 2022-05-31: qty 30

## 2022-05-31 MED ORDER — TETANUS-DIPHTH-ACELL PERTUSSIS 5-2.5-18.5 LF-MCG/0.5 IM SUSY
0.5000 mL | PREFILLED_SYRINGE | Freq: Once | INTRAMUSCULAR | Status: AC
  Administered 2022-05-31: 0.5 mL via INTRAMUSCULAR
  Filled 2022-05-31: qty 0.5

## 2022-05-31 NOTE — ED Provider Notes (Signed)
[place] EMERGENCY DEPARTMENT AT [facility] Provider Note   CSN: [id number] Arrival date & time: 05/31/22  [id number]     History  Chief Complaint  Patient presents with   Laceration    L Hand    Taylor Poole is a 22 y.o. female with past medical history anemia and depression who presents to the ED complaining of a laceration to the left hand. She states this afternoon she was using a kitchen knife to cut open a bottle of frozen ice to eat when the knife slipped and cut her hand. Minimal blood loss. She denies other associated injury. Last tetanus unknown.     Home Medications Prior to Admission medications   Medication Sig Start Date End Date Taking? Authorizing Provider  doxycycline (VIBRAMYCIN) 100 MG capsule Take 1 capsule (100 mg total) by mouth 2 (two) times daily. 01/10/21   Horton, Barbette Hair, MD  metroNIDAZOLE (FLAGYL) 500 MG tablet Take 1 tablet (500 mg total) by mouth 2 (two) times daily. 04/14/21   Lamptey, Myrene Galas, MD  potassium chloride SA (KLOR-CON) 20 MEQ tablet Take 1 tablet (20 mEq total) by mouth 2 (two) times daily. 07/24/19   Fredia Sorrow, MD  prochlorperazine (COMPAZINE) 10 MG tablet Take 1 tablet (10 mg total) by mouth 2 (two) times daily as needed for nausea or vomiting. 10/01/20   Idol, Almyra Free, PA-C  promethazine (PHENERGAN) 25 MG suppository Place 1 suppository (25 mg total) rectally every 6 (six) hours as needed for nausea or vomiting. 07/22/19   Milton Ferguson, MD  promethazine (PHENERGAN) 25 MG tablet Take 1 tablet (25 mg total) by mouth every 6 (six) hours as needed. 07/24/19   Fredia Sorrow, MD      Allergies    Patient has no known allergies.    Review of Systems   Review of Systems  All other systems reviewed and are negative.   Physical Exam Updated Vital Signs BP 129/72 (BP Location: Right Arm)   Pulse 94   Temp 98.9 F (37.2 C) (Oral)   Resp 17   SpO2 100%  Physical Exam Vitals and nursing note reviewed.  Constitutional:       General: She is not in acute distress.    Appearance: Normal appearance.  HENT:     Head: Normocephalic and atraumatic.     Mouth/Throat:     Mouth: Mucous membranes are moist.  Eyes:     Conjunctiva/sclera: Conjunctivae normal.  Cardiovascular:     Rate and Rhythm: Normal rate and regular rhythm.     Heart sounds: No murmur heard. Pulmonary:     Effort: Pulmonary effort is normal.     Breath sounds: Normal breath sounds.  Abdominal:     General: Abdomen is flat.     Palpations: Abdomen is soft.  Musculoskeletal:     Cervical back: Neck supple.     Right lower leg: No edema.     Left lower leg: No edema.     Comments: Range of motion intact to all digits and remainder of left hand and left upper extremity with 2+ radial pulse, normal sensation, soft compartments  Skin:    General: Skin is warm and dry.     Capillary Refill: Capillary refill takes less than 2 seconds.     Comments: 1cm linear laceration over left 2nd MCP joint, minimal active bleeding  Neurological:     Mental Status: She is alert. Mental status is at baseline.  Psychiatric:  Behavior: Behavior normal.     ED Results / Procedures / Treatments   Labs (all labs ordered are listed, but only abnormal results are displayed) Labs Reviewed - No data to display  EKG None  Radiology No results found.  Procedures .Marland KitchenLaceration Repair  Date/Time: 05/31/2022 8:00 PM  Performed by: Suzzette Righter, PA-C Authorized by: Suzzette Righter, PA-C   Consent:    Consent obtained:  Verbal   Consent given by:  Patient   Risks, benefits, and alternatives were discussed: yes     Risks discussed:  Infection, pain, retained foreign body, poor cosmetic result, need for additional repair, nerve damage, poor wound healing, tendon damage and vascular damage   Alternatives discussed:  No treatment, delayed treatment and observation Universal protocol:    Procedure explained and questions answered to patient or  proxy's satisfaction: yes     Patient identity confirmed:  Verbally with patient Anesthesia:    Anesthesia method:  Local infiltration   Local anesthetic:  Lidocaine 1% w/o epi Laceration details:    Location:  Hand   Hand location:  L hand, dorsum   Length (cm):  1 Pre-procedure details:    Preparation:  Patient was prepped and draped in usual sterile fashion Exploration:    Limited defect created (wound extended): no     Hemostasis achieved with:  Direct pressure   Imaging outcome: foreign body not noted     Wound exploration: wound explored through full range of motion and entire depth of wound visualized     Contaminated: no   Treatment:    Area cleansed with:  Povidone-iodine and saline   Amount of cleaning:  Standard   Irrigation solution:  Sterile saline   Irrigation method:  Tap   Visualized foreign bodies/material removed: no     Debridement:  None   Undermining:  None   Scar revision: no   Skin repair:    Repair method:  Sutures   Suture size:  4-0   Suture material:  Nylon   Suture technique:  Simple interrupted   Number of sutures:  2 Approximation:    Approximation:  Close Repair type:    Repair type:  Simple Post-procedure details:    Dressing:  Non-adherent dressing   Procedure completion:  Tolerated well, no immediate complications     Medications Ordered in ED Medications  Tdap (BOOSTRIX) injection 0.5 mL (0.5 mLs Intramuscular Given 05/31/22 1944)  lidocaine (PF) (XYLOCAINE) 1 % injection 30 mL (30 mLs Infiltration Given 05/31/22 1944)    ED Course/ Medical Decision Making/ A&P                             Medical Decision Making  Medical Decision Making:   Taylor Poole is a 22 y.o. female who presented to the ED today with hand laceration detailed above.     Complete initial physical exam performed, notably the patient had 2 cm superficial laceration over L MCP joint of 2nd digit of hand. She was neurovascularly intact with full range of  motion.    Reviewed and confirmed nursing documentation for past medical history, family history, social history.    Initial Assessment:   With the patient's presentation of hand laceration, most likely diagnosis is uncomplicated laceration. Differential diagnosis includes but is not limited to nerve/tendon injury, complicated laceration, retained foreign body, compartment syndrome, wound infection. This is most consistent with an acute complicated illness  Initial Plan:  TDAP updated Laceration repaired Objective evaluation as reviewed   Final Assessment and Plan:   This is a 22 year old female who presents to ED c/o laceration to left hand. Pt reports accidental laceration after kitchen knife slipped while cutting a bottle. She has ~1cm laceration over 2nd MCP joint of L hand. Range of motion, neurovascular exam intact. Pt reports no broken material, full depth of wound able to be explored and no foreign body identified. Laceration repaired with 2 simple interrupted sutures placed as above. Pt would have likely benefited from placement of 2rd suture but requesting to only have 2 replaced. She reports lingering sensation despite 11mL of 1% lidocaine placed for anesthesia. She did tolerate procedure otherwise well. Good hemostasis and close approximation was able to be achieved with 2 sutures so do believe this is reasonable. Pt given instructions for wound care and instructed to return in 7-10 days for suture removal. TDAP updated. Strict ED return precautions given, all questions answered, and stable for discharge.     Clinical Impression:  1. Laceration of left hand without foreign body, initial encounter      Data Unavailable           Final Clinical Impression(s) / ED Diagnoses Final diagnoses:  Laceration of left hand without foreign body, initial encounter    Rx / DC Orders ED Discharge Orders     None         Turner Daniels 05/31/22 2024    Drenda Freeze, MD 05/31/22 2220

## 2022-05-31 NOTE — Discharge Instructions (Addendum)
We updated your tetanus vaccination and repaired the laceration to your hand. Please care for it as instructed / according to attached instructions. Please have the sutures removed in 7-10 days. Keep the wound clean and dry. You may have the sutures removed at your primary care, an urgent care, or in the emergency department.   If you develop significant redness around the wound, pus-like drainage, fever, or other new, concerning symptoms, please be re-evaluated at nearest emergency department.

## 2022-05-31 NOTE — ED Triage Notes (Signed)
Pt presents with laceration from knife to the left hand. Bleeding controlled on arrival. Last tetanus shot unknown.

## 2022-12-07 ENCOUNTER — Emergency Department (HOSPITAL_COMMUNITY)
Admission: EM | Admit: 2022-12-07 | Discharge: 2022-12-08 | Disposition: A | Discharge status: Home / Self Care | Payer: Medicaid Other | Attending: Emergency Medicine | Admitting: Emergency Medicine

## 2022-12-07 ENCOUNTER — Other Ambulatory Visit: Payer: Self-pay

## 2022-12-07 ENCOUNTER — Encounter (HOSPITAL_COMMUNITY): Payer: Self-pay | Admitting: *Deleted

## 2022-12-07 DIAGNOSIS — Z202 Contact with and (suspected) exposure to infections with a predominantly sexual mode of transmission: Secondary | ICD-10-CM | POA: Insufficient documentation

## 2022-12-07 DIAGNOSIS — N926 Irregular menstruation, unspecified: Secondary | ICD-10-CM | POA: Insufficient documentation

## 2022-12-07 NOTE — ED Triage Notes (Signed)
Pt was informed of possible exposure to gonorrhea 3 months ago, denies any symptoms.

## 2022-12-08 LAB — PREGNANCY, URINE: Preg Test, Ur: NEGATIVE

## 2022-12-08 MED ORDER — STERILE WATER FOR INJECTION IJ SOLN
INTRAMUSCULAR | Status: AC
  Filled 2022-12-08: qty 10

## 2022-12-08 MED ORDER — DOXYCYCLINE HYCLATE 100 MG PO CAPS: 100.0000 mg | ORAL_CAPSULE | Freq: Two times a day (BID) | ORAL | 0 refills | Status: AC

## 2022-12-08 MED ORDER — CEFTRIAXONE SODIUM 500 MG IJ SOLR
500.0000 mg | Freq: Once | INTRAMUSCULAR | Status: AC
  Administered 2022-12-08: 500 mg via INTRAMUSCULAR
  Filled 2022-12-08: qty 500

## 2022-12-08 NOTE — ED Provider Notes (Signed)
  [place] EMERGENCY DEPARTMENT AT [facility] Provider Note   CSN: [id number] Arrival date & time: 12/07/22  1853     History  Chief Complaint  Patient presents with   Exposure to STD    Taylor Poole is a 22 y.o. female.  The history is provided by the patient.  Exposure to STD   Patient reports she was exposed to gonorrhea about 2 months ago.  She had no symptoms at that time, but she is now having irregular periods.  No fevers or vomiting.  No abdominal pain.  No vaginal discharge.  She has not been treated for this as of yet    Home Medications Prior to Admission medications   Not on File      Allergies    Patient has no known allergies.    Review of Systems   Review of Systems  Physical Exam Updated Vital Signs BP 107/83 (BP Location: Right Arm)   Pulse 79   Temp 98.3 F (36.8 C) (Oral)   Resp 14   Ht 1.575 m (5\' 2" )   Wt 72.6 kg   SpO2 99%   BMI 29.26 kg/m  Physical Exam CONSTITUTIONAL: Well developed/well nourished HEAD: Normocephalic/atraumatic NEURO: Pt is awake/alert/appropriate, moves all extremitiesx4.  No facial droop.  Walks around in no distress SKIN: warm, color normal PSYCH: no abnormalities of mood noted, alert and oriented to situation  ED Results / Procedures / Treatments   Labs (all labs ordered are listed, but only abnormal results are displayed) Labs Reviewed  PREGNANCY, URINE  GC/CHLAMYDIA PROBE AMP ([place]) NOT AT [facility]    EKG None  Radiology No results found.  Procedures Procedures    Medications Ordered in ED Medications - No data to display  ED Course/ Medical Decision Making/ A&P                                 Medical Decision Making Amount and/or Complexity of Data Reviewed Labs: ordered.   Patient presents for concern for STD exposure.  Will treat empirically.  Will also send GC chlamydia testing Patient has no other acute complaints at this time        Final Clinical  Impression(s) / ED Diagnoses Final diagnoses:  STD exposure    Rx / DC Orders ED Discharge Orders     None         Zadie Rhine, MD 12/08/22 [phone]

## 2022-12-08 NOTE — ED Notes (Signed)
Pt says she has had this anbx injection before with no problems- pt ok to leave at this time.

## 2022-12-09 LAB — GC/CHLAMYDIA PROBE AMP (~~LOC~~) NOT AT ARMC
Chlamydia: NEGATIVE
Comment: NEGATIVE
Comment: NORMAL
Neisseria Gonorrhea: NEGATIVE
